# Patient Record
Sex: Female | Born: 1965 | Hispanic: Yes | Smoking: Former smoker
Health system: Southern US, Community
[De-identification: ages and names within clinical notes are randomized; demographics above are authoritative.]

## PROBLEM LIST (undated history)

## (undated) DIAGNOSIS — E663 Overweight: Secondary | ICD-10-CM

## (undated) DIAGNOSIS — E785 Hyperlipidemia, unspecified: Secondary | ICD-10-CM

## (undated) DIAGNOSIS — I10 Essential (primary) hypertension: Secondary | ICD-10-CM

## (undated) HISTORY — DX: Essential (primary) hypertension: I10

---
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
## 2014-12-22 DIAGNOSIS — I10 Essential (primary) hypertension: Secondary | ICD-10-CM

## 2014-12-22 HISTORY — DX: Essential (primary) hypertension: I10

## 2015-11-30 ENCOUNTER — Encounter (HOSPITAL_COMMUNITY): Payer: Self-pay | Admitting: Emergency Medicine

## 2015-11-30 ENCOUNTER — Emergency Department (HOSPITAL_COMMUNITY): Payer: No Typology Code available for payment source

## 2015-11-30 ENCOUNTER — Emergency Department (HOSPITAL_COMMUNITY)
Admission: EM | Admit: 2015-11-30 | Discharge: 2015-11-30 | Disposition: A | Discharge status: Home / Self Care | Payer: No Typology Code available for payment source | Attending: Emergency Medicine | Admitting: Emergency Medicine

## 2015-11-30 DIAGNOSIS — Y9389 Activity, other specified: Secondary | ICD-10-CM | POA: Insufficient documentation

## 2015-11-30 DIAGNOSIS — M25561 Pain in right knee: Secondary | ICD-10-CM

## 2015-11-30 DIAGNOSIS — Y998 Other external cause status: Secondary | ICD-10-CM | POA: Insufficient documentation

## 2015-11-30 DIAGNOSIS — M542 Cervicalgia: Secondary | ICD-10-CM

## 2015-11-30 DIAGNOSIS — S8991XA Unspecified injury of right lower leg, initial encounter: Secondary | ICD-10-CM | POA: Insufficient documentation

## 2015-11-30 DIAGNOSIS — S0990XA Unspecified injury of head, initial encounter: Secondary | ICD-10-CM | POA: Insufficient documentation

## 2015-11-30 DIAGNOSIS — S199XXA Unspecified injury of neck, initial encounter: Secondary | ICD-10-CM | POA: Diagnosis present

## 2015-11-30 DIAGNOSIS — Y92410 Unspecified street and highway as the place of occurrence of the external cause: Secondary | ICD-10-CM | POA: Diagnosis not present

## 2015-11-30 MED ORDER — METHOCARBAMOL 500 MG PO TABS
1000.0000 mg | ORAL_TABLET | Freq: Once | ORAL | Status: AC
  Administered 2015-11-30: 1000 mg via ORAL
  Filled 2015-11-30: qty 2

## 2015-11-30 MED ORDER — IBUPROFEN 800 MG PO TABS: 800.0000 mg | ORAL_TABLET | Freq: Three times a day (TID) | ORAL | Status: DC

## 2015-11-30 MED ORDER — OXYCODONE-ACETAMINOPHEN 5-325 MG PO TABS
1.0000 | ORAL_TABLET | Freq: Once | ORAL | Status: AC
  Administered 2015-11-30: 1 via ORAL
  Filled 2015-11-30: qty 1

## 2015-11-30 MED ORDER — METHOCARBAMOL 500 MG PO TABS: 500.0000 mg | ORAL_TABLET | Freq: Two times a day (BID) | ORAL | Status: DC

## 2015-11-30 MED ORDER — OXYCODONE-ACETAMINOPHEN 5-325 MG PO TABS: 1.0000 | ORAL_TABLET | ORAL | Status: DC | PRN

## 2015-11-30 MED ORDER — IBUPROFEN 400 MG PO TABS
800.0000 mg | ORAL_TABLET | Freq: Once | ORAL | Status: AC
  Administered 2015-11-30: 800 mg via ORAL
  Filled 2015-11-30: qty 2

## 2015-11-30 NOTE — ED Notes (Signed)
Pt ambulatory to bathroom without any problems 
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
## 2015-12-20 ENCOUNTER — Encounter (HOSPITAL_COMMUNITY): Payer: Self-pay | Admitting: *Deleted

## 2015-12-20 ENCOUNTER — Emergency Department (HOSPITAL_COMMUNITY)
Admission: EM | Admit: 2015-12-20 | Discharge: 2015-12-20 | Disposition: A | Discharge status: Home / Self Care | Payer: Self-pay | Attending: Emergency Medicine | Admitting: Emergency Medicine

## 2015-12-20 ENCOUNTER — Emergency Department (HOSPITAL_COMMUNITY): Payer: Self-pay

## 2015-12-20 DIAGNOSIS — Z79899 Other long term (current) drug therapy: Secondary | ICD-10-CM | POA: Insufficient documentation

## 2015-12-20 DIAGNOSIS — Y998 Other external cause status: Secondary | ICD-10-CM | POA: Insufficient documentation

## 2015-12-20 DIAGNOSIS — S8991XA Unspecified injury of right lower leg, initial encounter: Secondary | ICD-10-CM | POA: Insufficient documentation

## 2015-12-20 DIAGNOSIS — M5431 Sciatica, right side: Secondary | ICD-10-CM | POA: Insufficient documentation

## 2015-12-20 DIAGNOSIS — Y92410 Unspecified street and highway as the place of occurrence of the external cause: Secondary | ICD-10-CM | POA: Insufficient documentation

## 2015-12-20 DIAGNOSIS — Y9389 Activity, other specified: Secondary | ICD-10-CM | POA: Insufficient documentation

## 2015-12-20 DIAGNOSIS — S79911A Unspecified injury of right hip, initial encounter: Secondary | ICD-10-CM | POA: Insufficient documentation

## 2015-12-20 MED ORDER — CYCLOBENZAPRINE HCL 10 MG PO TABS: 5.0000 mg | ORAL_TABLET | Freq: Two times a day (BID) | ORAL | Status: DC | PRN

## 2015-12-20 MED ORDER — TRAMADOL HCL 50 MG PO TABS: 50.0000 mg | ORAL_TABLET | Freq: Four times a day (QID) | ORAL | Status: DC | PRN
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
## 2016-01-23 ENCOUNTER — Other Ambulatory Visit (HOSPITAL_COMMUNITY): Payer: Self-pay | Admitting: Orthopaedic Surgery

## 2016-01-25 ENCOUNTER — Other Ambulatory Visit (HOSPITAL_COMMUNITY): Payer: Self-pay | Admitting: Orthopaedic Surgery

## 2016-01-25 DIAGNOSIS — M545 Low back pain: Secondary | ICD-10-CM

## 2016-03-28 ENCOUNTER — Ambulatory Visit: Payer: Self-pay | Admitting: Internal Medicine

## 2016-03-30 ENCOUNTER — Encounter: Payer: Self-pay | Admitting: Internal Medicine

## 2016-03-30 NOTE — Progress Notes (Signed)
Patient left before being seen.  Had a job interview within an hour of the appointment and was afraid she would miss the interview.

## 2017-07-20 ENCOUNTER — Emergency Department (HOSPITAL_COMMUNITY)
Admission: EM | Admit: 2017-07-20 | Discharge: 2017-07-21 | Disposition: A | Discharge status: Home / Self Care | Payer: Medicaid Other | Attending: Emergency Medicine | Admitting: Emergency Medicine

## 2017-07-20 DIAGNOSIS — R5383 Other fatigue: Secondary | ICD-10-CM | POA: Insufficient documentation

## 2017-07-20 DIAGNOSIS — R531 Weakness: Secondary | ICD-10-CM | POA: Insufficient documentation

## 2017-07-20 DIAGNOSIS — R0602 Shortness of breath: Secondary | ICD-10-CM | POA: Diagnosis not present

## 2017-07-20 LAB — CBC WITH DIFFERENTIAL/PLATELET
Basophils Absolute: 0 10*3/uL (ref 0.0–0.1)
Basophils Relative: 0 %
EOS ABS: 0.1 10*3/uL (ref 0.0–0.7)
EOS PCT: 2 %
HCT: 37.1 % (ref 36.0–46.0)
Hemoglobin: 12.2 g/dL (ref 12.0–15.0)
LYMPHS ABS: 1.4 10*3/uL (ref 0.7–4.0)
LYMPHS PCT: 21 %
MCH: 29.8 pg (ref 26.0–34.0)
MCHC: 32.9 g/dL (ref 30.0–36.0)
MCV: 90.5 fL (ref 78.0–100.0)
MONO ABS: 0.4 10*3/uL (ref 0.1–1.0)
Monocytes Relative: 6 %
Neutro Abs: 4.8 10*3/uL (ref 1.7–7.7)
Neutrophils Relative %: 71 %
PLATELETS: 233 10*3/uL (ref 150–400)
RBC: 4.1 MIL/uL (ref 3.87–5.11)
RDW: 13.4 % (ref 11.5–15.5)
WBC: 6.7 10*3/uL (ref 4.0–10.5)

## 2017-07-20 LAB — BASIC METABOLIC PANEL
Anion gap: 7 (ref 5–15)
BUN: 16 mg/dL (ref 6–20)
CALCIUM: 8.9 mg/dL (ref 8.9–10.3)
CO2: 26 mmol/L (ref 22–32)
CREATININE: 0.76 mg/dL (ref 0.44–1.00)
Chloride: 105 mmol/L (ref 101–111)
GFR calc Af Amer: >60 mL/min (ref >60)
GLUCOSE: 154 mg/dL — AB (ref 65–99)
Potassium: 3 mmol/L — ABNORMAL LOW (ref 3.5–5.1)
SODIUM: 138 mmol/L (ref 135–145)

## 2017-07-20 MED ORDER — ONDANSETRON HCL 4 MG PO TABS: 4.0000 mg | ORAL_TABLET | Freq: Once | ORAL | Status: DC

## 2017-07-20 MED ORDER — SODIUM CHLORIDE 0.9 % IV BOLUS (SEPSIS)
1000.0000 mL | Freq: Once | INTRAVENOUS | Status: AC
  Administered 2017-07-20: 1000 mL via INTRAVENOUS

## 2017-07-20 NOTE — ED Provider Notes (Signed)
[facility]-EMERGENCY DEPT Provider Note   CSN: [id number] Arrival date & time: 07/20/17  2026     History   Chief Complaint Chief Complaint  Patient presents with  . Fatigue  . Nausea    HPI Marie Ayala is a 51 y.o. female.  The history is provided by the patient and the spouse. The history is limited by a language barrier. A language interpreter was used.   Patient presents by EMS with reported weakness and numbness/tingling throughout body. Sudden onset of symptoms after phone call and eating pork and rice.  Limited responses on exam, eyes closed. Poor effort.   She denies any chest pain, shortness of breath, fevers, chills, nausea, vomiting, or diarrhea. Husband denies previous symptoms similar to this episode.   No past medical history on file.  There are no active problems to display for this patient.   No past surgical history on file.  OB History    No data available       Home Medications    Prior to Admission medications   Medication Sig Start Date End Date Taking? Authorizing Provider  PRESCRIPTION MEDICATION Blood pressure   Yes [provider]    Family History No family history on file.  Social History Social History  Substance Use Topics  . Smoking status: Not on file  . Smokeless tobacco: Not on file  . Alcohol use Not on file     Allergies   Patient has no known allergies.   Review of Systems Review of Systems  Constitutional: Negative for chills and fever.  HENT: Negative for ear pain and sore throat.   Eyes: Negative for pain and visual disturbance.  Respiratory: Positive for shortness of breath. Negative for cough.   Cardiovascular: Negative for chest pain and palpitations.  Gastrointestinal: Negative for abdominal pain and vomiting.  Genitourinary: Negative for dysuria and hematuria.  Musculoskeletal: Negative for arthralgias and back pain.  Skin: Negative for color change and rash.  Neurological: Positive for weakness  (diffuse body). Negative for seizures and syncope.  All other systems reviewed and are negative.    Physical Exam Updated Vital Signs BP 133/80   Pulse 85   Temp 98.1 F (36.7 C) (Oral)   Resp 12   SpO2 99%   Physical Exam  Constitutional: She is oriented to person, place, and time. She appears well-developed and well-nourished. No distress.  HENT:  Head: Normocephalic and atraumatic.  Eyes: Conjunctivae are normal.  Neck: Neck supple.  Cardiovascular: Normal rate and regular rhythm.   No murmur heard. Pulmonary/Chest: Effort normal and breath sounds normal. No respiratory distress.  Abdominal: Soft. There is no tenderness.  Musculoskeletal: She exhibits no edema.  Neurological: She is alert and oriented to person, place, and time. No cranial nerve deficit or sensory deficit. She exhibits normal muscle tone. Coordination normal.  Diffuse weakness on exam, (1-2/5 strength diffusely), inconsistent, able to walk to bathroom without difficulty.   Skin: Skin is warm and dry.  Psychiatric: She has a normal mood and affect.  Nursing note and vitals reviewed.    ED Treatments / Results  Labs (all labs ordered are listed, but only abnormal results are displayed) Labs Reviewed  BASIC METABOLIC PANEL - Abnormal; Notable for the following:       Result Value   Potassium 3.0 (*)    Glucose, Bld 154 (*)    All other components within normal limits  CBC WITH DIFFERENTIAL/PLATELET    EKG  EKG Interpretation  Date/Time:  Monday July 20 2017 20:57:25 EDT Ventricular Rate:  87 PR Interval:    QRS Duration: 89 QT Interval:  373 QTC Calculation: 449 R Axis:   57 Text Interpretation:  Sinus rhythm Borderline prolonged PR interval Probable left atrial enlargement Borderline ECG Confirmed by Gerhard MunchLockwood, Robert [phone]([id number]) on 07/20/2017 11:18:58 PM       Radiology No results found.  Procedures Procedures (including critical care time)  Medications Ordered in ED Medications    ondansetron (ZOFRAN) tablet 4 mg (not administered)  sodium chloride 0.9 % bolus 1,000 mL (1,000 mLs Intravenous New Bag/Given 07/20/17 2137)     Initial Impression / Assessment and Plan / ED Course  I have reviewed the triage vital signs and the nursing notes.  Pertinent labs & imaging results that were available during my care of the patient were reviewed by me and considered in my medical decision making (see chart for details).     Upon further discussion with patient with myself and Attending, there have been significant stressors in patient's life. She has just returned yesterday from trip from [place] with previous partner - but did not return with son. This has been stressful to her. Patient seen to be moving all extremities (albiet with poor effort on exam). No focal deficits. Able to stand and walk. Stable labs and ekg without ischemic findings. Suspect symptoms due to stress and fatigue from recent events. Safe for discharge home with close follow up. Discussed return precautions with patient and family.   Final Clinical Impressions(s) / ED Diagnoses   Final diagnoses:  Weakness    New Prescriptions New Prescriptions   No medications on file     Wynelle ClevelandMizera, Lieutenant Abarca, MD 07/20/17 2324    Gerhard MunchLockwood, Robert, MD 07/22/17 Moses [id number]

## 2017-07-20 NOTE — ED Triage Notes (Signed)
Pt arrived via ems after experiencing weakness following dinner. Pt was c/o of numbness and tingling all over. Pt stated she ate pork and rice for dinner and felt nauseas after eating. Pt is very lethargic and selective with responses from staff. Pt is oriented to self, city and date but not hospital. [place]

## 2017-07-20 NOTE — Discharge Instructions (Signed)
Your exam and work up were reassuring at this time. Please return to ED with any new or concerning symptoms.

## 2017-07-22 ENCOUNTER — Other Ambulatory Visit: Payer: Self-pay

## 2017-07-22 ENCOUNTER — Emergency Department (HOSPITAL_COMMUNITY): Payer: Medicaid Other

## 2017-07-22 ENCOUNTER — Emergency Department (HOSPITAL_COMMUNITY)
Admission: EM | Admit: 2017-07-22 | Discharge: 2017-07-22 | Disposition: A | Discharge status: Home / Self Care | Payer: Medicaid Other | Attending: Emergency Medicine | Admitting: Emergency Medicine

## 2017-07-22 ENCOUNTER — Encounter (HOSPITAL_COMMUNITY): Payer: Self-pay | Admitting: *Deleted

## 2017-07-22 DIAGNOSIS — R200 Anesthesia of skin: Secondary | ICD-10-CM | POA: Diagnosis not present

## 2017-07-22 DIAGNOSIS — R42 Dizziness and giddiness: Secondary | ICD-10-CM | POA: Diagnosis present

## 2017-07-22 LAB — URINALYSIS, ROUTINE W REFLEX MICROSCOPIC
Bilirubin Urine: NEGATIVE
GLUCOSE, UA: NEGATIVE mg/dL
KETONES UR: NEGATIVE mg/dL
NITRITE: NEGATIVE
PROTEIN: NEGATIVE mg/dL
Specific Gravity, Urine: 1.006 (ref 1.005–1.030)
pH: 8 (ref 5.0–8.0)

## 2017-07-22 LAB — DIFFERENTIAL
BASOS PCT: 0 %
Basophils Absolute: 0 10*3/uL (ref 0.0–0.1)
EOS ABS: 0.1 10*3/uL (ref 0.0–0.7)
EOS PCT: 2 %
Lymphocytes Relative: 37 %
Lymphs Abs: 2.9 10*3/uL (ref 0.7–4.0)
Monocytes Absolute: 0.5 10*3/uL (ref 0.1–1.0)
Monocytes Relative: 7 %
NEUTROS PCT: 54 %
Neutro Abs: 4.3 10*3/uL (ref 1.7–7.7)

## 2017-07-22 LAB — COMPREHENSIVE METABOLIC PANEL
ALBUMIN: 3.9 g/dL (ref 3.5–5.0)
ALT: 15 U/L (ref 14–54)
ANION GAP: 9 (ref 5–15)
AST: 25 U/L (ref 15–41)
Alkaline Phosphatase: 86 U/L (ref 38–126)
BUN: 13 mg/dL (ref 6–20)
CALCIUM: 9 mg/dL (ref 8.9–10.3)
CHLORIDE: 106 mmol/L (ref 101–111)
CO2: 26 mmol/L (ref 22–32)
Creatinine, Ser: 0.73 mg/dL (ref 0.44–1.00)
GFR calc Af Amer: >60 mL/min (ref >60)
GFR calc non Af Amer: >60 mL/min (ref >60)
GLUCOSE: 126 mg/dL — AB (ref 65–99)
POTASSIUM: 3.3 mmol/L — AB (ref 3.5–5.1)
Sodium: 141 mmol/L (ref 135–145)
TOTAL PROTEIN: 7.2 g/dL (ref 6.5–8.1)
Total Bilirubin: 0.8 mg/dL (ref 0.3–1.2)

## 2017-07-22 LAB — I-STAT TROPONIN, ED
Troponin i, poc: 0 ng/mL (ref 0.00–0.08)
Troponin i, poc: 0 ng/mL (ref 0.00–0.08)

## 2017-07-22 LAB — CBC
HCT: 38.6 % (ref 36.0–46.0)
Hemoglobin: 12.9 g/dL (ref 12.0–15.0)
MCH: 30 pg (ref 26.0–34.0)
MCHC: 33.4 g/dL (ref 30.0–36.0)
MCV: 89.8 fL (ref 78.0–100.0)
Platelets: 254 10*3/uL (ref 150–400)
RBC: 4.3 MIL/uL (ref 3.87–5.11)
RDW: 12.9 % (ref 11.5–15.5)
WBC: 7.8 10*3/uL (ref 4.0–10.5)

## 2017-07-22 LAB — RAPID URINE DRUG SCREEN, HOSP PERFORMED
AMPHETAMINES: NOT DETECTED
BARBITURATES: NOT DETECTED
BENZODIAZEPINES: NOT DETECTED
Cocaine: NOT DETECTED
Opiates: NOT DETECTED
Tetrahydrocannabinol: POSITIVE — AB

## 2017-07-22 LAB — PROTIME-INR
INR: 0.97
PROTHROMBIN TIME: 12.9 s (ref 11.4–15.2)

## 2017-07-22 LAB — APTT: APTT: 26 s (ref 24–36)

## 2017-07-22 LAB — SALICYLATE LEVEL

## 2017-07-22 LAB — ACETAMINOPHEN LEVEL

## 2017-07-22 LAB — LIPASE, BLOOD: LIPASE: 25 U/L (ref 11–51)

## 2017-07-22 LAB — ETHANOL: Alcohol, Ethyl (B): <5 mg/dL (ref <5)

## 2017-07-22 MED ORDER — MECLIZINE HCL 25 MG PO TABS: 25.0000 mg | ORAL_TABLET | Freq: Three times a day (TID) | ORAL | 0 refills | Status: DC | PRN

## 2017-07-22 MED ORDER — SODIUM CHLORIDE 0.9 % IV BOLUS (SEPSIS)
1000.0000 mL | Freq: Once | INTRAVENOUS | Status: AC
  Administered 2017-07-22: 1000 mL via INTRAVENOUS

## 2017-07-22 MED ORDER — LORAZEPAM 2 MG/ML IJ SOLN: 1.0000 mg | Freq: Once | INTRAMUSCULAR | Status: DC

## 2017-07-22 NOTE — ED Notes (Signed)
TTS being completed. 

## 2017-07-22 NOTE — Discharge Instructions (Addendum)
Contact [facility] to establish care with a primary care provider for regular, routine medical care.  This clinic accepts patients without medical insurance. A primary care provider can adjust your daily medications and give you refills.

## 2017-07-22 NOTE — ED Provider Notes (Signed)
No medical hx, no medication.  Came in for dizziness, numbness in mouth and body, slurred speech, feeling drunk with nausea and epigastric abd pain.  Similar past Monday, and evaluation and thought it was related to stress.    Appears sleepy, drunk.  Plan to r/o pontine stroke with MRI.  Will check ETOH.  If negative, consider TTS consultation for further psychiatric assessment.  Pt currently denies SI/HI/AVH.  6:31 PM Brain MRI without concerning finding.  Therefore, will consult TTS for further assessment.    9:53 PM TTS has seen and evaluated pt and felt pt is psychiatrically cleared.  Pt currently resting comfortably, her sxs has resolved.  She feels comfortable going home.  Pt given resources for outpt f/u for further care.  Meclizine prescribed for dizziness. Return precaution discussed. All questions answered to pt's satisfaction.   BP 122/72   Pulse 68   Temp 97.7 F (36.5 C)   Resp 15   SpO2 99%   Results for orders placed or performed during the hospital encounter of 07/22/17  Protime-INR  Result Value Ref Range   Prothrombin Time 12.9 11.4 - 15.2 seconds   INR 0.97   APTT  Result Value Ref Range   aPTT 26 24 - 36 seconds  CBC  Result Value Ref Range   WBC 7.8 4.0 - 10.5 K/uL   RBC 4.30 3.87 - 5.11 MIL/uL   Hemoglobin 12.9 12.0 - 15.0 g/dL   HCT 16.1 09.6 - 04.5 %   MCV 89.8 78.0 - 100.0 fL   MCH 30.0 26.0 - 34.0 pg   MCHC 33.4 30.0 - 36.0 g/dL   RDW 40.9 81.1 - 91.4 %   Platelets 254 150 - 400 K/uL  Differential  Result Value Ref Range   Neutrophils Relative % 54 %   Neutro Abs 4.3 1.7 - 7.7 K/uL   Lymphocytes Relative 37 %   Lymphs Abs 2.9 0.7 - 4.0 K/uL   Monocytes Relative 7 %   Monocytes Absolute 0.5 0.1 - 1.0 K/uL   Eosinophils Relative 2 %   Eosinophils Absolute 0.1 0.0 - 0.7 K/uL   Basophils Relative 0 %   Basophils Absolute 0.0 0.0 - 0.1 K/uL  Comprehensive metabolic panel  Result Value Ref Range   Sodium 141 135 - 145 mmol/L   Potassium 3.3 (L)  3.5 - 5.1 mmol/L   Chloride 106 101 - 111 mmol/L   CO2 26 22 - 32 mmol/L   Glucose, Bld 126 (H) 65 - 99 mg/dL   BUN 13 6 - 20 mg/dL   Creatinine, Ser 7.82 0.44 - 1.00 mg/dL   Calcium 9.0 8.9 - 95.6 mg/dL   Total Protein 7.2 6.5 - 8.1 g/dL   Albumin 3.9 3.5 - 5.0 g/dL   AST 25 15 - 41 U/L   ALT 15 14 - 54 U/L   Alkaline Phosphatase 86 38 - 126 U/L   Total Bilirubin 0.8 0.3 - 1.2 mg/dL   GFR calc non Af Amer >60 >60 mL/min   GFR calc Af Amer >60 >60 mL/min   Anion gap 9 5 - 15  Urinalysis, Routine w reflex microscopic  Result Value Ref Range   Color, Urine STRAW (A) YELLOW   APPearance CLEAR CLEAR   Specific Gravity, Urine 1.006 1.005 - 1.030   pH 8.0 5.0 - 8.0   Glucose, UA NEGATIVE NEGATIVE mg/dL   Hgb urine dipstick SMALL (A) NEGATIVE   Bilirubin Urine NEGATIVE NEGATIVE   Ketones, ur NEGATIVE NEGATIVE  mg/dL   Protein, ur NEGATIVE NEGATIVE mg/dL   Nitrite NEGATIVE NEGATIVE   Leukocytes, UA TRACE (A) NEGATIVE   RBC / HPF 0-5 0 - 5 RBC/hpf   WBC, UA 0-5 0 - 5 WBC/hpf   Bacteria, UA RARE (A) NONE SEEN   Squamous Epithelial / LPF 0-5 (A) NONE SEEN   Mucous PRESENT    Hyaline Casts, UA PRESENT   Rapid urine drug screen (hospital performed)  Result Value Ref Range   Opiates NONE DETECTED NONE DETECTED   Cocaine NONE DETECTED NONE DETECTED   Benzodiazepines NONE DETECTED NONE DETECTED   Amphetamines NONE DETECTED NONE DETECTED   Tetrahydrocannabinol POSITIVE (A) NONE DETECTED   Barbiturates NONE DETECTED NONE DETECTED  Salicylate level  Result Value Ref Range   Salicylate Lvl <7.0 2.8 - 30.0 mg/dL  Acetaminophen level  Result Value Ref Range   Acetaminophen (Tylenol), Serum <10 (L) 10 - 30 ug/mL  Lipase, blood  Result Value Ref Range   Lipase 25 11 - 51 U/L  Ethanol  Result Value Ref Range   Alcohol, Ethyl (B) <5 <5 mg/dL  I-stat troponin, ED  Result Value Ref Range   Troponin i, poc 0.00 0.00 - 0.08 ng/mL   Comment 3          I-Stat Troponin, ED (not [facility])   Result Value Ref Range   Troponin i, poc 0.00 0.00 - 0.08 ng/mL   Comment 3           Ct Head Wo Contrast  Result Date: 07/22/2017 CLINICAL DATA:  Dizziness and upper extremity weakness.  Lethargy. EXAM: CT HEAD WITHOUT CONTRAST TECHNIQUE: Contiguous axial images were obtained from the base of the skull through the vertex without intravenous contrast. COMPARISON:  None. FINDINGS: Brain: The ventricles are normal in size and configuration. There is no intracranial mass, hemorrhage, extra-axial fluid collection, or midline shift. Gray-white compartments appear normal. No evident acute infarct. Vascular: No hyperdense vessel. There is minimal calcification in the left carotid siphon. Skull: Bony calvarium appears intact. Sinuses/Orbits: There is mucosal thickening in several ethmoid air cells bilaterally. Other visualized paranasal sinuses are clear. Orbits appear symmetric bilaterally. Other: Mastoid air cells are clear. IMPRESSION: No intracranial mass, hemorrhage, or extra-axial fluid collection. Gray-white compartments appear normal. Minimal left carotid siphon calcification. Mild ethmoid air cell mucosal thickening bilaterally. Electronically Signed   By: Bretta BangWilliam  Woodruff III M.D.   On: 07/22/2017 13:10   Mr Brain Wo Contrast  Result Date: 07/22/2017 CLINICAL DATA:  Altered mental status and numbness. EXAM: MRI HEAD WITHOUT CONTRAST TECHNIQUE: Multiplanar, multiecho pulse sequences of the brain and surrounding structures were obtained without intravenous contrast. COMPARISON:  Head CT 07/22/2017 FINDINGS: Brain: The midline structures are normal. There is no focal diffusion restriction to indicate acute infarct. The brain parenchymal signal is normal and there is no mass lesion. No intraparenchymal hematoma or chronic microhemorrhage. Brain volume is normal for age without age-advanced or lobar predominant atrophy. The dura is normal and there is no extra-axial collection. Vascular: Major intracranial  arterial and venous sinus flow voids are preserved. Skull and upper cervical spine: The visualized skull base, calvarium, upper cervical spine and extracranial soft tissues are normal. Sinuses/Orbits: No fluid levels or advanced mucosal thickening. No mastoid or middle ear effusion. Normal orbits. IMPRESSION: Normal MRI of the brain. Electronically Signed   By: Deatra RobinsonKevin  Herman M.D.   On: 07/22/2017 17:36      Fayrene Helperran, Jannifer Fischler, PA-C 07/22/17 2154    Azalia Bilisampos, Kevin,  MD 07/22/17 2346
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
## 2017-07-22 NOTE — ED Notes (Signed)
Patient to MRI.

## 2017-07-22 NOTE — BH Assessment (Signed)
Tele Assessment Note   Marie Ayala is an 51 y.o. female patient presenting to the ED with physical symptoms of dizziness, numbness, complaints of slurred speech. The patient reports she has experienced these symptoms in the past. The patient spoke through a spanish interpreter. She denied SI, HI and A/V. Indicates she has no history of mental health issues but did receive counseling at one time. Denied previous psychiatric inpatient. Denied past attempts of suicide. Denied drug or alcohol use despite positive UDS for cannabis.   The patient reports arguing with her 50 yr old daughter earlier in the day. States they had a "small arguement" about money that she lent her daughter. The patient lives with her husband and son, 636 yrs old. The patient works at [employer], full-time. Denies missing work due to physical or mental health issues. The patient had unremarkable appearance, fair eye contact, freedom of movement, logical speech, euthymic mood, blunted affect, panic attacks "every now and then", unimpaired judgment, fair insight and impulse control.    Marie Ayala recommends outpatient resources. Psychiatrically cleared.      Diagnosis: Generalized Anxiety Disorder  Past Medical History: History reviewed. No pertinent past medical history.  Past Surgical History:  Procedure Laterality Date  . CESAREAN SECTION      Family History: History reviewed. No pertinent family history.  Social History:  reports that she has never smoked. She does not have any smokeless tobacco history on file. She reports that she does not drink alcohol or use drugs.  Additional Social History:     CIWA: CIWA-Ar BP: 110/70 Pulse Rate: 68 COWS:    PATIENT STRENGTHS: (choose at least two) Average or above average intelligence General fund of knowledge  Allergies: No Known Allergies  Home Medications:  (Not in a hospital admission)  OB/GYN Status:  No LMP recorded. Patient is  postmenopausal.  General Assessment Data [facility] Assessment: In system Is this a Tele or Face-to-Face Assessment?: Tele Assessment Is this an Initial Assessment or a Re-assessment for this encounter?: Initial Assessment Marital status: Married GerlachMaiden name: n/a Is patient pregnant?: No Pregnancy Status: No Living Arrangements: Spouse/significant other Can pt return to current living arrangement?: Yes Admission Status: Voluntary Is patient capable of signing voluntary admission?: Yes Referral Source: Self/Family/Friend Insurance type: self pay     Crisis Care Plan Living Arrangements: Spouse/significant other Name of Psychiatrist: no Name of Therapist: yes  Education Status Is patient currently in school?: No Highest grade of school patient has completed: 9th grade   Risk to self with the past 6 months Suicidal Ideation: No Has patient been a risk to self within the past 6 months prior to admission? : No Suicidal Intent: No Has patient had any suicidal intent within the past 6 months prior to admission? : No Is patient at risk for suicide?: No Suicidal Plan?: No Has patient had any suicidal plan within the past 6 months prior to admission? : No Access to Means: No What has been your use of drugs/alcohol within the last 12 months?: positive UDS for cannabis Previous Attempts/Gestures: No How many times?: 0 Other Self Harm Risks: 0 Intentional Self Injurious Behavior: None Family Suicide History: No Recent stressful life event(s): Conflict (Comment) (with daughter) Persecutory voices/beliefs?: No Depression: No Substance abuse history and/or treatment for substance abuse?: No (denies) Suicide prevention information given to non-admitted patients: Not applicable  Risk to Others within the past 6 months Homicidal Ideation: No Does patient have any lifetime  risk of violence toward others beyond the six months prior to admission? : No Thoughts of  Harm to Others: No Current Homicidal Intent: No Current Homicidal Plan: No Access to Homicidal Means: No History of harm to others?: No Assessment of Violence: None Noted Does patient have access to weapons?: No Criminal Charges Pending?: No Does patient have a court date: No Is patient on probation?: No  Psychosis Hallucinations: None noted Delusions: None noted  Mental Status Report Appearance/Hygiene: Unremarkable Eye Contact: Fair Motor Activity: Freedom of movement Speech: Logical/coherent Level of Consciousness: Alert Mood: Euthymic Affect: Blunted Anxiety Level: Panic Attacks Panic attack frequency: "every now and then" Thought Processes: Coherent, Relevant Judgement: Unimpaired Orientation: Person, Place, Time, Situation Obsessive Compulsive Thoughts/Behaviors: None  Cognitive Functioning Concentration: Normal Memory: Recent Intact, Remote Intact IQ: Average Insight: Poor Impulse Control: Poor Appetite: Good Weight Loss: 0 Weight Gain: 0 Sleep: No Change Vegetative Symptoms: None  [facility] Patient's cognitive ability adequate to safely complete daily activities?: Yes Patient able to express need for assistance with ADLs?: Yes Independently performs ADLs?: Yes (appropriate for developmental age)  Prior Inpatient Therapy Prior Inpatient Therapy: No  Prior Outpatient Therapy Prior Outpatient Therapy: Yes Prior Therapy Dates: unknown  Prior Therapy Facilty/Provider(s): unknown Reason for Treatment: unknown Does patient have an ACCT team?: No Does patient have Intensive In-House Services?  : No Does patient have [facility] services? : No Does patient have P4[facility] services?: No  ADL Screening (condition at time of admission) Patient's cognitive ability adequate to safely complete daily activities?: Yes Patient able to express need for assistance with ADLs?: Yes Independently performs ADLs?: Yes (appropriate for developmental  age)             Merchant navy officerAdvance Directives (For Healthcare) Does Patient Have a Medical Advance Directive?: No    Additional Information 1:1 In Past 12 Months?: No CIRT Risk: No Elopement Risk: No Does patient have medical clearance?: Yes     Disposition:  Disposition Initial Assessment Completed for this Encounter: Yes Disposition of Patient: Other dispositions Other disposition(s): Other (Comment)  [facility] 07/22/2017 7:42 PM

## 2017-07-22 NOTE — BH Assessment (Signed)
Donell SievertSpencer Simon NP provider on call recommends outpatient resources. Psychiatrically cleared

## 2017-07-22 NOTE — ED Notes (Signed)
Pt. Back from MRI. Pt. Resting comfortably.

## 2017-07-22 NOTE — ED Triage Notes (Signed)
Used interpretor. Pt reports onset of "numbness" to entire body today while eating today. Has dizziness and feels like room is spinning. Has nausea. Denies headache or sensitivity to light. Has weakness to bilateral arms and legs, weak grips bilaterally. No unilateral weakness noted. No speech deficit noted, no facial droop noted.

## 2017-07-22 NOTE — ED Notes (Signed)
Patient c/o dizziness and epigastric abd pain and h/a for last 3 days. Seen here and was discharged. Patient states it hasn't gotten better. Generalized weakness. Low bp. Fatigue. Numbness/tingling all over. Denies n/v/d. No significant medical hx.

## 2017-07-23 LAB — URINE CULTURE

## 2017-07-28 ENCOUNTER — Encounter: Payer: Self-pay | Admitting: Internal Medicine

## 2017-07-28 ENCOUNTER — Ambulatory Visit (INDEPENDENT_AMBULATORY_CARE_PROVIDER_SITE_OTHER): Payer: Self-pay | Admitting: Internal Medicine

## 2017-07-28 VITALS — BP 170/110 | HR 70 | Resp 12 | Ht 64.0 in | Wt 164.0 lb

## 2017-07-28 DIAGNOSIS — Z7689 Persons encountering health services in other specified circumstances: Secondary | ICD-10-CM

## 2017-07-28 DIAGNOSIS — R079 Chest pain, unspecified: Secondary | ICD-10-CM

## 2017-07-28 DIAGNOSIS — I10 Essential (primary) hypertension: Secondary | ICD-10-CM | POA: Insufficient documentation

## 2017-07-28 DIAGNOSIS — Z13220 Encounter for screening for lipoid disorders: Secondary | ICD-10-CM

## 2017-07-28 HISTORY — DX: Essential (primary) hypertension: I10

## 2017-07-28 MED ORDER — METOPROLOL TARTRATE 25 MG PO TABS: 25.0000 mg | ORAL_TABLET | Freq: Two times a day (BID) | ORAL | 11 refills | Status: DC

## 2017-07-28 NOTE — Progress Notes (Signed)
   Subjective:    Patient ID: Marie Ayala, female    DOB: 09/14/1966, 51 y.o.   MRN: [id number]  HPI   1.  High Blood pressure:  Thinks she was diagnosed 2 years ago.  Was on medication for a while, which she took once daily.  Was visiting in [place] at the time.  She only took for one month.  Did not return to the clinic to see if her bp was controlled. Has been living in [place] years.  Previously did live in [place] area.   Is having headaches with elevated bp.  Had some anterior chest pain 2 weeks ago when walking.  Lasted 2 minutes--sat down and relaxed.  Had never had before.  No radiation to back, jaw, arm.  No associated dyspnea.    No outpatient prescriptions have been marked as taking for the 07/28/17 encounter (Office Visit) with Julieanne MansonMulberry, Stephan Nelis, MD.    No Known Allergies   Past Medical History:  Diagnosis Date  . Hypertension 2016   Past Surgical History:  Procedure Laterality Date  . CESAREAN SECTION  1991, 2001, 2012    Family History  Problem Relation Age of Onset  . Hypertension Mother   . Heart disease Mother        MI  . Diabetes Mother     Social History   Social History  . Marital status: Married    Spouse name: Ang[place]Israel Davialo  . Number of children: 5  . Years of education: 9   Occupational History  . unemployed. Worked at [employer] previoulsly    Social History Main Topics  . Smoking status: Never Smoker  . Smokeless tobacco: Never Used  . Alcohol use No  . Drug use: No  . Sexual activity: Not on file   Other Topics Concern  . Not on file   Social History Narrative   Originally from [place]   [place] [id number]   [place]ives in [place] with husband and young son.   50 yo daughter lives in [place] with her father.   Rest of children are adults and on their own     Review of Systems     Objective:   Physical Exam   HEENT: PERRL, EOMI, discs sharp bilaterally, throat without injection Neck:  Supple, No  adenopathy.  No thyromegaly Chest:  CTA CV:  RRR with normal S1 and S2, No S3, S4 or murmur.  No carotid bruits.  Carotid, radial,  DP and PT pulses normal and equal Abd:  S, NT, No HSM or mass, + BS.  No bruits LE:  No edema.   ECG:  NSR, normal ECG     Assessment & Plan:  1.  Essential Hypertension:  Start Metoprolol 25 mg twice daily with recheck of bp and pulse in 1 week.   Long and repeated discussion regarding need to stay on medication and follow up to be certain medication is adequately treating bp. Discussion that her bp will go back up if she stops.  2.  Chest pain 2 weeks ago:  Short lived and has had regular physical activity since without pain.  Check ECG:  Returned with NSR and normal.   CBC, CMP, FLP. Followup with me in 2 months.  3.  Social:  Tawnya Crookybreisha Terry, LCSWA here to assess patient, warm hand off

## 2017-07-29 LAB — CBC WITH DIFFERENTIAL/PLATELET
BASOS: 0 %
Basophils Absolute: 0 10*3/uL (ref 0.0–0.2)
EOS (ABSOLUTE): 0.2 10*3/uL (ref 0.0–0.4)
Eos: 2 %
HEMATOCRIT: 39.2 % (ref 34.0–46.6)
HEMOGLOBIN: 12.9 g/dL (ref 11.1–15.9)
IMMATURE GRANS (ABS): 0 10*3/uL (ref 0.0–0.1)
Immature Granulocytes: 0 %
LYMPHS: 27 %
Lymphocytes Absolute: 1.8 10*3/uL (ref 0.7–3.1)
MCH: 30.3 pg (ref 26.6–33.0)
MCHC: 32.9 g/dL (ref 31.5–35.7)
MCV: 92 fL (ref 79–97)
MONOCYTES: 8 %
Monocytes Absolute: 0.5 10*3/uL (ref 0.1–0.9)
NEUTROS ABS: 4.3 10*3/uL (ref 1.4–7.0)
Neutrophils: 63 %
Platelets: 214 10*3/uL (ref 150–379)
RBC: 4.26 x10E6/uL (ref 3.77–5.28)
RDW: 13.9 % (ref 12.3–15.4)
WBC: 6.8 10*3/uL (ref 3.4–10.8)

## 2017-07-29 LAB — COMPREHENSIVE METABOLIC PANEL
A/G RATIO: 1.6 (ref 1.2–2.2)
ALBUMIN: 4.5 g/dL (ref 3.5–5.5)
ALT: 19 IU/L (ref 0–32)
AST: 18 IU/L (ref 0–40)
Alkaline Phosphatase: 97 IU/L (ref 39–117)
BILIRUBIN TOTAL: 0.6 mg/dL (ref 0.0–1.2)
BUN / CREAT RATIO: 25 — AB (ref 9–23)
BUN: 14 mg/dL (ref 6–24)
CHLORIDE: 104 mmol/L (ref 96–106)
CO2: 25 mmol/L (ref 20–29)
Calcium: 9.4 mg/dL (ref 8.7–10.2)
Creatinine, Ser: 0.56 mg/dL — ABNORMAL LOW (ref 0.57–1.00)
GFR calc non Af Amer: 110 mL/min/{1.73_m2} (ref >59)
GFR, EST AFRICAN AMERICAN: 127 mL/min/{1.73_m2} (ref >59)
Globulin, Total: 2.9 g/dL (ref 1.5–4.5)
Glucose: 90 mg/dL (ref 65–99)
POTASSIUM: 4.6 mmol/L (ref 3.5–5.2)
Sodium: 144 mmol/L (ref 134–144)
TOTAL PROTEIN: 7.4 g/dL (ref 6.0–8.5)

## 2017-07-29 LAB — LIPID PANEL W/O CHOL/HDL RATIO
Cholesterol, Total: 298 mg/dL — ABNORMAL HIGH (ref 100–199)
HDL: 48 mg/dL (ref >39)
LDL Calculated: 195 mg/dL — ABNORMAL HIGH (ref 0–99)
Triglycerides: 274 mg/dL — ABNORMAL HIGH (ref 0–149)
VLDL CHOLESTEROL CAL: 55 mg/dL — AB (ref 5–40)

## 2017-08-01 MED ORDER — ATORVASTATIN CALCIUM 40 MG PO TABS: 40.0000 mg | ORAL_TABLET | Freq: Every day | ORAL | 11 refills | Status: DC

## 2017-08-01 NOTE — Addendum Note (Signed)
Addended by: Marcene DuosMULBERRY, Myron Stankovich M on: 08/01/2017 12:33 PM   Modules accepted: Orders

## 2017-08-06 ENCOUNTER — Ambulatory Visit (INDEPENDENT_AMBULATORY_CARE_PROVIDER_SITE_OTHER): Payer: Self-pay

## 2017-08-06 VITALS — BP 124/72 | HR 78

## 2017-08-06 DIAGNOSIS — I10 Essential (primary) hypertension: Secondary | ICD-10-CM

## 2017-08-06 NOTE — Progress Notes (Signed)
Discussed with Dr. Margo AyeMulberry--her notes regarding labs and elevated cholesterol did not go through for some reason.  Patient was to start on Atorvastatin 40 mg daily and be scheduled for return visit in 6 weeks with FLP/Hepatic profile.

## 2017-09-28 ENCOUNTER — Ambulatory Visit: Payer: Self-pay | Admitting: Internal Medicine

## 2017-10-06 ENCOUNTER — Ambulatory Visit: Payer: Self-pay | Admitting: Internal Medicine

## 2017-10-20 ENCOUNTER — Other Ambulatory Visit: Payer: Medicaid Other

## 2017-10-23 ENCOUNTER — Other Ambulatory Visit (INDEPENDENT_AMBULATORY_CARE_PROVIDER_SITE_OTHER): Payer: Self-pay

## 2017-10-23 DIAGNOSIS — Z79899 Other long term (current) drug therapy: Secondary | ICD-10-CM

## 2017-10-23 DIAGNOSIS — Z13220 Encounter for screening for lipoid disorders: Secondary | ICD-10-CM

## 2017-10-24 LAB — LIPID PANEL W/O CHOL/HDL RATIO
Cholesterol, Total: 166 mg/dL (ref 100–199)
HDL: 44 mg/dL (ref >39)
LDL CALC: 100 mg/dL — AB (ref 0–99)
Triglycerides: 112 mg/dL (ref 0–149)
VLDL CHOLESTEROL CAL: 22 mg/dL (ref 5–40)

## 2017-10-24 LAB — HEPATIC FUNCTION PANEL
ALT: 14 IU/L (ref 0–32)
AST: 13 IU/L (ref 0–40)
Albumin: 4.1 g/dL (ref 3.5–5.5)
Alkaline Phosphatase: 98 IU/L (ref 39–117)
BILIRUBIN TOTAL: 0.6 mg/dL (ref 0.0–1.2)
BILIRUBIN, DIRECT: 0.14 mg/dL (ref 0.00–0.40)
TOTAL PROTEIN: 6.8 g/dL (ref 6.0–8.5)

## 2017-11-02 ENCOUNTER — Other Ambulatory Visit (INDEPENDENT_AMBULATORY_CARE_PROVIDER_SITE_OTHER): Payer: Medicaid Other

## 2017-11-02 DIAGNOSIS — R5383 Other fatigue: Secondary | ICD-10-CM

## 2017-11-02 LAB — POCT URINE PREGNANCY: Preg Test, Ur: NEGATIVE

## 2017-12-22 DIAGNOSIS — E785 Hyperlipidemia, unspecified: Secondary | ICD-10-CM | POA: Insufficient documentation

## 2017-12-22 HISTORY — DX: Hyperlipidemia, unspecified: E78.5

## 2018-04-30 ENCOUNTER — Other Ambulatory Visit: Payer: Self-pay

## 2018-04-30 DIAGNOSIS — R748 Abnormal levels of other serum enzymes: Secondary | ICD-10-CM

## 2018-05-01 LAB — HEPATIC FUNCTION PANEL
ALT: 13 IU/L (ref 0–32)
AST: 12 IU/L (ref 0–40)
Albumin: 4.2 g/dL (ref 3.5–5.5)
Alkaline Phosphatase: 96 IU/L (ref 39–117)
BILIRUBIN TOTAL: 0.5 mg/dL (ref 0.0–1.2)
Bilirubin, Direct: 0.12 mg/dL (ref 0.00–0.40)
Total Protein: 7 g/dL (ref 6.0–8.5)

## 2018-09-17 ENCOUNTER — Ambulatory Visit: Payer: Medicaid Other

## 2018-09-17 VITALS — BP 152/90 | HR 68

## 2018-09-17 DIAGNOSIS — I10 Essential (primary) hypertension: Secondary | ICD-10-CM | POA: Diagnosis not present

## 2018-09-17 NOTE — Progress Notes (Signed)
Patient states she has been having dizziness, headaches and feeling like something is holding her around her throat. Patient has not been taking Metoprolol as directed. Patient states she took 2 pills at one time yesterday and before that she was only taking one a day.. States headache and dizziness started 2 days ago.  Per Dr. Delrae Alfred no changes to medication at this time. Patient to consistently taking medication twice a day. And repeat BP check in 2 weeks for nurse visit. Patient verbalized understanding.

## 2018-10-01 ENCOUNTER — Ambulatory Visit (INDEPENDENT_AMBULATORY_CARE_PROVIDER_SITE_OTHER): Payer: Medicaid Other

## 2018-10-01 VITALS — BP 124/82 | HR 74

## 2018-10-01 DIAGNOSIS — I10 Essential (primary) hypertension: Secondary | ICD-10-CM

## 2018-10-01 NOTE — Progress Notes (Signed)
Patient BP now in normal range. Patient instructed to continue taking medication daily. Patient verbalized understanding. Patient has not seen Dr. Delrae Alfred in over a year. Patient informed she needs follow up with doctor and appointment has been scheduled

## 2018-10-28 ENCOUNTER — Other Ambulatory Visit: Payer: Self-pay | Admitting: Internal Medicine

## 2018-10-29 ENCOUNTER — Other Ambulatory Visit: Payer: Self-pay

## 2018-10-29 ENCOUNTER — Ambulatory Visit: Payer: Medicaid Other | Admitting: Internal Medicine

## 2018-10-29 MED ORDER — METOPROLOL TARTRATE 25 MG PO TABS: 25.0000 mg | ORAL_TABLET | Freq: Two times a day (BID) | ORAL | 1 refills | Status: DC

## 2018-12-02 ENCOUNTER — Encounter: Payer: Self-pay | Admitting: Internal Medicine

## 2018-12-02 ENCOUNTER — Ambulatory Visit: Payer: Medicaid Other | Admitting: Internal Medicine

## 2018-12-02 VITALS — BP 160/102 | HR 80 | Resp 12 | Ht 64.0 in | Wt 172.0 lb

## 2018-12-02 DIAGNOSIS — Z716 Tobacco abuse counseling: Secondary | ICD-10-CM | POA: Diagnosis not present

## 2018-12-02 DIAGNOSIS — H547 Unspecified visual loss: Secondary | ICD-10-CM

## 2018-12-02 DIAGNOSIS — I10 Essential (primary) hypertension: Secondary | ICD-10-CM | POA: Diagnosis not present

## 2018-12-02 DIAGNOSIS — E7800 Pure hypercholesterolemia, unspecified: Secondary | ICD-10-CM

## 2018-12-02 MED ORDER — ATORVASTATIN CALCIUM 40 MG PO TABS: 40.0000 mg | ORAL_TABLET | Freq: Every day | ORAL | 11 refills | Status: DC

## 2018-12-02 MED ORDER — METOPROLOL TARTRATE 25 MG PO TABS: 25.0000 mg | ORAL_TABLET | Freq: Two times a day (BID) | ORAL | 11 refills | Status: DC

## 2018-12-02 NOTE — Progress Notes (Signed)
   Subjective:    Patient ID: Marie Ayala, female    DOB: 10/10/1966, 52 y.o.   MRN: [id number]  HPI   Has not been seen in over a year.   1.  Hypertension:  Ran out of Metoprolol last month.  No chest pain, exertional dyspnea or peripheral edema.  2.  Hyperlipidemia:  Also out of Atorvastatin for 2 months. No problems on the medication.  3. Rare tobacco use:  Smokes once to twice monthly.  She states she does not need support to quit.     No outpatient medications have been marked as taking for the 12/02/18 encounter (Office Visit) with Julieanne MansonMulberry, Kennedy Bohanon, MD.    No Known Allergies  Review of Systems     Objective:   Physical Exam NAD HEENT:  PERRL, EOMI Neck:  Supple, no adenopathy, no thyromegaly Chest:  CTA CV:  RRR with normal S1 and S2, No S3, S4 or murmur.  Radial and DP pulses normal and equal Abd:  S, NT, No HSM or mass + BS LE:  No edema.       Assessment & Plan:  1.  Hypertension:  Restart Metoprolol 25 mg twice daily.  Discussed needs to follow up once to twice yearly. BP and pulse check in 1-2 weeks   2.  Hyperlipidemia:  Restart Atorvastatin 40 mg daily.  FLP and hepatic profile in 6 weeks.  3.  HM:  CPE with pap in 3 months  4.  Decreased Visual Acuity:  Referral to Optometry  5.  Tobacco use:  Plans to quit completely.  Gave quit line to see about getting nicotine lozenges for free and support.  She does not feel she will need.

## 2018-12-22 DIAGNOSIS — E663 Overweight: Secondary | ICD-10-CM

## 2018-12-22 HISTORY — DX: Overweight: E66.3

## 2018-12-24 ENCOUNTER — Ambulatory Visit: Payer: Medicaid Other | Admitting: Internal Medicine

## 2018-12-24 VITALS — BP 140/90 | HR 70

## 2018-12-24 DIAGNOSIS — I10 Essential (primary) hypertension: Secondary | ICD-10-CM

## 2018-12-26 NOTE — Progress Notes (Signed)
Here for BP check BP improved, though not quite at goal with restart of medication. No change to meds at this time. Has follow up scheduled

## 2019-01-13 ENCOUNTER — Other Ambulatory Visit (INDEPENDENT_AMBULATORY_CARE_PROVIDER_SITE_OTHER): Payer: Medicaid Other

## 2019-01-13 DIAGNOSIS — E7800 Pure hypercholesterolemia, unspecified: Secondary | ICD-10-CM

## 2019-01-13 DIAGNOSIS — Z79899 Other long term (current) drug therapy: Secondary | ICD-10-CM

## 2019-01-14 LAB — LIPID PANEL W/O CHOL/HDL RATIO
Cholesterol, Total: 182 mg/dL (ref 100–199)
HDL: 40 mg/dL (ref >39)
LDL Calculated: 103 mg/dL — ABNORMAL HIGH (ref 0–99)
Triglycerides: 193 mg/dL — ABNORMAL HIGH (ref 0–149)
VLDL CHOLESTEROL CAL: 39 mg/dL (ref 5–40)

## 2019-01-14 LAB — HEPATIC FUNCTION PANEL
ALBUMIN: 4.2 g/dL (ref 3.8–4.9)
ALT: 19 IU/L (ref 0–32)
AST: 21 IU/L (ref 0–40)
Alkaline Phosphatase: 100 IU/L (ref 39–117)
BILIRUBIN TOTAL: 0.9 mg/dL (ref 0.0–1.2)
Bilirubin, Direct: 0.17 mg/dL (ref 0.00–0.40)
Total Protein: 6.9 g/dL (ref 6.0–8.5)

## 2019-03-10 ENCOUNTER — Encounter: Payer: Medicaid Other | Admitting: Internal Medicine

## 2019-04-18 ENCOUNTER — Encounter: Payer: Medicaid Other | Admitting: Internal Medicine

## 2019-06-13 ENCOUNTER — Encounter: Payer: Self-pay | Admitting: Internal Medicine

## 2019-08-02 ENCOUNTER — Ambulatory Visit: Payer: Medicaid Other | Admitting: Internal Medicine

## 2019-08-02 ENCOUNTER — Encounter: Payer: Self-pay | Admitting: Internal Medicine

## 2019-08-02 ENCOUNTER — Other Ambulatory Visit: Payer: Self-pay

## 2019-08-02 VITALS — BP 138/84 | HR 70 | Temp 98.3°F | Resp 12 | Ht 64.0 in | Wt 168.0 lb

## 2019-08-02 DIAGNOSIS — I10 Essential (primary) hypertension: Secondary | ICD-10-CM

## 2019-08-02 DIAGNOSIS — E782 Mixed hyperlipidemia: Secondary | ICD-10-CM

## 2019-08-02 DIAGNOSIS — Z1211 Encounter for screening for malignant neoplasm of colon: Secondary | ICD-10-CM

## 2019-08-02 DIAGNOSIS — E010 Iodine-deficiency related diffuse (endemic) goiter: Secondary | ICD-10-CM

## 2019-08-02 DIAGNOSIS — E663 Overweight: Secondary | ICD-10-CM

## 2019-08-02 DIAGNOSIS — N898 Other specified noninflammatory disorders of vagina: Secondary | ICD-10-CM

## 2019-08-02 DIAGNOSIS — Z0000 Encounter for general adult medical examination without abnormal findings: Secondary | ICD-10-CM

## 2019-08-02 DIAGNOSIS — Z716 Tobacco abuse counseling: Secondary | ICD-10-CM

## 2019-08-02 DIAGNOSIS — Z79899 Other long term (current) drug therapy: Secondary | ICD-10-CM

## 2019-08-02 DIAGNOSIS — Z720 Tobacco use: Secondary | ICD-10-CM

## 2019-08-02 DIAGNOSIS — Z124 Encounter for screening for malignant neoplasm of cervix: Secondary | ICD-10-CM

## 2019-08-02 DIAGNOSIS — Z1239 Encounter for other screening for malignant neoplasm of breast: Secondary | ICD-10-CM

## 2019-08-02 LAB — POCT WET PREP WITH KOH
KOH Prep POC: NEGATIVE
RBC Wet Prep HPF POC: NEGATIVE
Trichomonas, UA: NEGATIVE
Yeast Wet Prep HPF POC: NEGATIVE

## 2019-08-02 NOTE — Patient Instructions (Signed)

## 2019-08-02 NOTE — Progress Notes (Signed)
Subjective:    Patient ID: Marie Ayala, female   DOB: 04/25/1966, 53 y.o.   MRN: [id number]   HPI   CPE with pap  1.  Pap:  Last pap 3 years ago and normal.  Last was in [place].  History of abnormal pap years ago.  Describes colposcopy with no concerning findings.  Follow up pap smears were normal.    2.  Mammogram:  Never. No family history of breast cancer.  3.  Osteoprevention:    She drinks 2% milk once daily.   No family history of osteoporosis.  Willing to drink 3 cups daily.  No exercise.  Willing to start walking daily.  4.  Guaiac Cards: Never.   5.  Colonoscopy:  Never.  No family history of colon cancer.  6.  Immunizations:  Does not obtain influenza-listens to people she knows who state it causes more illness.  Discussed cannot cause flu Believes she has had Td in past 10 years.    7.  Glucose/Cholesterol:  Blood glucose normal in the past. Has hyperlipidemia Lipid Panel     Component Value Date/Time   CHOL 182 01/13/2019 0913   TRIG 193 (H) 01/13/2019 0913   HDL 40 01/13/2019 0913   LDLCALC 103 (H) 01/13/2019 0913    Current Meds  Medication Sig  . atorvastatin (LIPITOR) 40 MG tablet Take 1 tablet (40 mg total) by mouth daily. With evening meal  . metoprolol tartrate (LOPRESSOR) 25 MG tablet Take 1 tablet (25 mg total) by mouth 2 (two) times daily.   No Known Allergies   Past Medical History:  Diagnosis Date  . Essential hypertension 07/28/2017  . Hyperlipidemia 2019  . Hypertension 2016  . Overweight (BMI 25.0-29.9) 2020    Past Surgical History:  Procedure Laterality Date  . CESAREAN SECTION  1991, 2001, 2012    Family History  Problem Relation Age of Onset  . Hypertension Mother   . Heart disease Mother        MI  . Diabetes Mother      Social History   Socioeconomic History  . Marital status: Married    Spouse name: [place]Israel Davila  . Number of children: 5  . Years of education: 9  . Highest education  level: Not on file  Occupational History  . Occupation: unemployed. Worked at [employer] previoulsly  Social Needs  . Financial resource strain: Hard  . Food insecurity    Worry: Never true    Inability: Never true  . Transportation needs    Medical: No    Non-medical: No  Tobacco Use  . Smoking status: Current Some Day Smoker    Types: Cigarettes  . Smokeless tobacco: Never Used  . Tobacco comment: Stats needs no support--only smokes once to twice monthly  Substance and Sexual Activity  . Alcohol use: No  . Drug use: No  . Sexual activity: Yes    Birth control/protection: Post-menopausal  Lifestyle  . Physical activity    Days per week: 0 days    Minutes per session: 0 min  . Stress: Rather much  Relationships  . Social Musicianconnections    Talks on phone: Not on file    Gets together: Not on file    Attends religious service: Not on file    Active member of club or organization: Not on file    Attends meetings of clubs or organizations: Not on file    Relationship status: Married  .  Intimate partner violence    Fear of current or ex partner: No    Emotionally abused: No    Physically abused: No    Forced sexual activity: No  Other Topics Concern  . Not on file  Social History Narrative   During pandemic, having some difficulty with bills as husband not working due to [employer] being closed--he cleans there.   Lives in [place] with husband and young son   Rest of children are adults and on their own      Review of Systems  Constitutional: Negative for appetite change, fatigue and fever.  HENT: Negative for ear pain, hearing loss, rhinorrhea and sore throat.   Eyes: Negative for visual disturbance.  Respiratory: Negative for cough and shortness of breath.   Cardiovascular: Negative for chest pain, palpitations and leg swelling.  Gastrointestinal: Negative for abdominal pain, blood in stool (No melena), constipation and diarrhea.  Genitourinary: Negative for dysuria  and menstrual problem (postmenopausal.  LMP at age 53).  Musculoskeletal: Negative for arthralgias.  Skin: Negative for rash.  Neurological: Negative for weakness and numbness.  Psychiatric/Behavioral: Negative for dysphoric mood. The patient is not nervous/anxious.       Objective:   BP 138/84 (BP Location: Left Arm, Patient Position: Sitting, Cuff Size: Normal)   Pulse 70   Temp 98.3 F (36.8 C)   Resp 12   Ht 5\' 4"  (1.626 m)   Wt 168 lb (76.2 kg)   BMI 28.84 kg/m   Physical Exam  Constitutional: She is oriented to person, place, and time. She appears well-developed and well-nourished.  HENT:  Head: Normocephalic and atraumatic.  Right Ear: Hearing, tympanic membrane, external ear and ear canal normal.  Left Ear: Hearing, tympanic membrane, external ear and ear canal normal.  Nose: Nose normal.  Mouth/Throat: Uvula is midline, oropharynx is clear and moist and mucous membranes are normal.  Eyes: Pupils are equal, round, and reactive to light. Conjunctivae and EOM are normal.  Discs sharp bilaterally  Neck: Normal range of motion and full passive range of motion without pain. Neck supple. Thyromegaly present. No thyroid mass present.  Cardiovascular: Normal rate, regular rhythm, S1 normal and S2 normal. Exam reveals no S3, no S4 and no friction rub.  No murmur heard. No carotid bruits.  Carotid, radial, femoral, DP and PT pulses normal and equal.   Pulmonary/Chest: Effort normal and breath sounds normal. Right breast exhibits no inverted nipple, no mass, no nipple discharge, no skin change and no tenderness. Left breast exhibits no inverted nipple, no mass, no nipple discharge, no skin change and no tenderness.  Abdominal: Soft. Bowel sounds are normal. She exhibits no mass. There is no hepatosplenomegaly. There is no abdominal tenderness. No hernia.  Genitourinary:    Genitourinary Comments: Normal external female genitalia.  Vaginal and cervical mucosa atrophic and mildly  friable.  Scant yellow proximal vaginal discharge, no odor.  No uterine or adnexal mass or tenderness. Rectal:  No mass, heme negative stool.      Musculoskeletal: Normal range of motion.  Lymphadenopathy:       Head (right side): No submental and no submandibular adenopathy present.       Head (left side): No submental and no submandibular adenopathy present.    She has no cervical adenopathy.    She has no axillary adenopathy.       Right: No inguinal and no supraclavicular adenopathy present.       Left: No inguinal and no supraclavicular adenopathy  present.  Neurological: She is alert and oriented to person, place, and time. She has normal strength and normal reflexes. No cranial nerve deficit or sensory deficit. Coordination and gait normal.  Skin: Skin is warm. No rash noted.  Psychiatric: She has a normal mood and affect. Her speech is normal and behavior is normal. Judgment and thought content normal. Cognition and memory are normal.    Assessment & Plan  1.  CPE with pap Guaiac Cards x 3 to return in 2 weeks. Wet prep support BV, but asymptomatic, so no treatment Referral for screening colonoscopy. Mammogram ordered CBC, CMP  2.  Tobacco abuse:  Discussed options for supportive treatment for cessation.  Nicotine gum or lozenges best option.  Quit Therapist, music given.  3.  Hyperlipidemia:  FLP  4.  Generous thyroid:  TSH  5.  Overweight:  Encouraged lifestyle changes with diet and physical activity.

## 2019-08-03 LAB — CBC WITH DIFFERENTIAL/PLATELET
Basophils Absolute: 0 10*3/uL (ref 0.0–0.2)
Basos: 1 %
EOS (ABSOLUTE): 0.2 10*3/uL (ref 0.0–0.4)
Eos: 3 %
Hematocrit: 40 % (ref 34.0–46.6)
Hemoglobin: 12.8 g/dL (ref 11.1–15.9)
Immature Grans (Abs): 0 10*3/uL (ref 0.0–0.1)
Immature Granulocytes: 0 %
Lymphocytes Absolute: 2.1 10*3/uL (ref 0.7–3.1)
Lymphs: 32 %
MCH: 29.8 pg (ref 26.6–33.0)
MCHC: 32 g/dL (ref 31.5–35.7)
MCV: 93 fL (ref 79–97)
Monocytes Absolute: 0.5 10*3/uL (ref 0.1–0.9)
Monocytes: 8 %
Neutrophils Absolute: 3.7 10*3/uL (ref 1.4–7.0)
Neutrophils: 56 %
Platelets: 239 10*3/uL (ref 150–450)
RBC: 4.29 x10E6/uL (ref 3.77–5.28)
RDW: 12.9 % (ref 11.7–15.4)
WBC: 6.5 10*3/uL (ref 3.4–10.8)

## 2019-08-03 LAB — COMPREHENSIVE METABOLIC PANEL
ALT: 13 IU/L (ref 0–32)
AST: 20 IU/L (ref 0–40)
Albumin/Globulin Ratio: 1.7 (ref 1.2–2.2)
Albumin: 4.3 g/dL (ref 3.8–4.9)
Alkaline Phosphatase: 86 IU/L (ref 39–117)
BUN/Creatinine Ratio: 24 — ABNORMAL HIGH (ref 9–23)
BUN: 16 mg/dL (ref 6–24)
Bilirubin Total: 0.6 mg/dL (ref 0.0–1.2)
CO2: 26 mmol/L (ref 20–29)
Calcium: 9.4 mg/dL (ref 8.7–10.2)
Chloride: 103 mmol/L (ref 96–106)
Creatinine, Ser: 0.67 mg/dL (ref 0.57–1.00)
GFR calc Af Amer: 118 mL/min/{1.73_m2} (ref >59)
GFR calc non Af Amer: 102 mL/min/{1.73_m2} (ref >59)
Globulin, Total: 2.6 g/dL (ref 1.5–4.5)
Glucose: 89 mg/dL (ref 65–99)
Potassium: 4.3 mmol/L (ref 3.5–5.2)
Sodium: 143 mmol/L (ref 134–144)
Total Protein: 6.9 g/dL (ref 6.0–8.5)

## 2019-08-03 LAB — LIPID PANEL W/O CHOL/HDL RATIO
Cholesterol, Total: 197 mg/dL (ref 100–199)
HDL: 46 mg/dL (ref >39)
LDL Calculated: 119 mg/dL — ABNORMAL HIGH (ref 0–99)
Triglycerides: 158 mg/dL — ABNORMAL HIGH (ref 0–149)
VLDL Cholesterol Cal: 32 mg/dL (ref 5–40)

## 2019-08-03 LAB — CYTOLOGY - PAP

## 2019-08-03 LAB — TSH: TSH: 1.4 u[IU]/mL (ref 0.450–4.500)

## 2019-09-08 ENCOUNTER — Encounter: Payer: Self-pay | Admitting: Internal Medicine

## 2019-10-30 ENCOUNTER — Encounter: Payer: Self-pay | Admitting: Internal Medicine

## 2019-11-02 DIAGNOSIS — E010 Iodine-deficiency related diffuse (endemic) goiter: Secondary | ICD-10-CM | POA: Insufficient documentation

## 2019-11-02 DIAGNOSIS — Z720 Tobacco use: Secondary | ICD-10-CM | POA: Insufficient documentation

## 2019-11-25 ENCOUNTER — Other Ambulatory Visit: Payer: Self-pay

## 2019-11-25 ENCOUNTER — Ambulatory Visit
Admission: RE | Admit: 2019-11-25 | Discharge: 2019-11-25 | Disposition: A | Discharge status: Home / Self Care | Payer: Medicaid Other | Source: Ambulatory Visit | Attending: Internal Medicine | Admitting: Internal Medicine

## 2019-11-25 DIAGNOSIS — Z1239 Encounter for other screening for malignant neoplasm of breast: Secondary | ICD-10-CM

## 2019-12-05 ENCOUNTER — Telehealth: Payer: Self-pay | Admitting: Internal Medicine

## 2019-12-05 NOTE — Telephone Encounter (Signed)
Patient called requesting Rx on Atorvastatin and Metoprolol to be called in at [facility].  Please advise

## 2019-12-13 IMAGING — MG DIGITAL SCREENING BILAT W/ TOMO W/ CAD
8 series · 8 of 24 positions shown · non-contrast
Comparison: This is the patient's baseline mammogram.

CLINICAL DATA: Screening.

EXAM:
DIGITAL SCREENING BILATERAL MAMMOGRAM WITH TOMO AND CAD

[L MLO synth-2D]
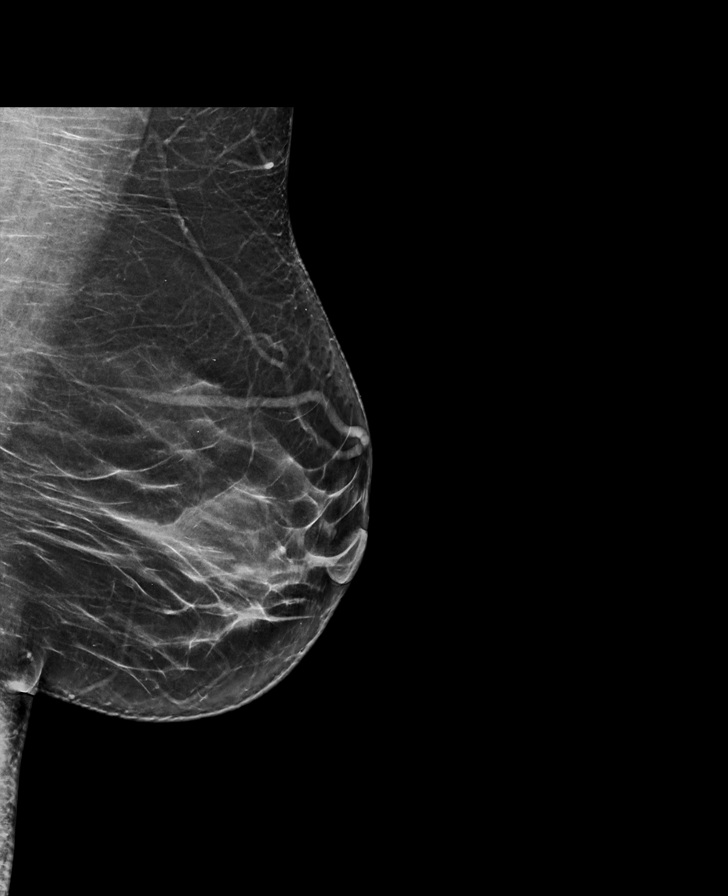

[R MLO synth-2D]
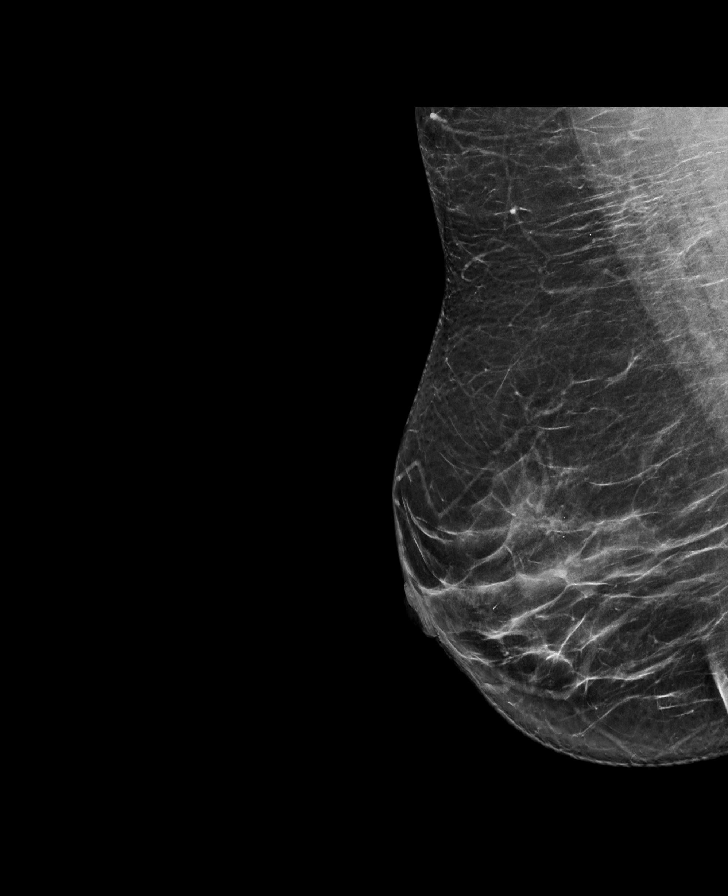

[R CC synth-2D]
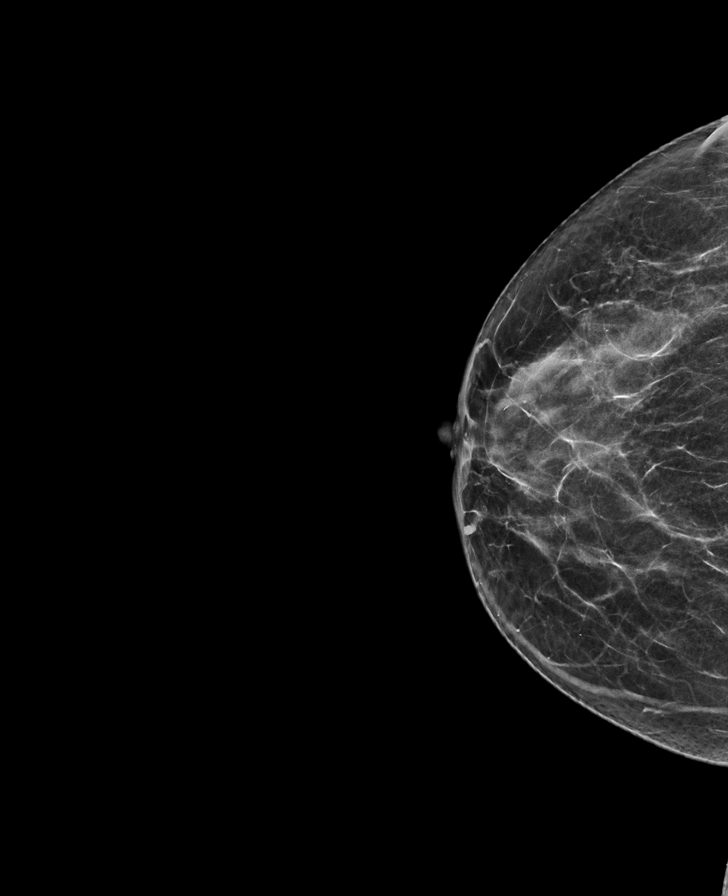

[L CC synth-2D]
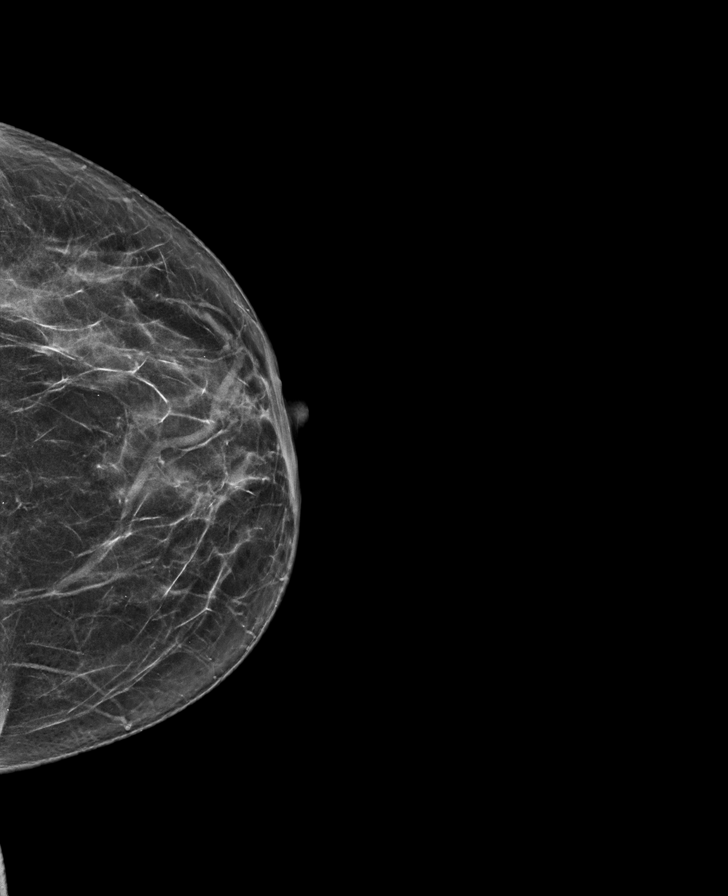

[L MLO tomo · tomo slice 39/76.0]
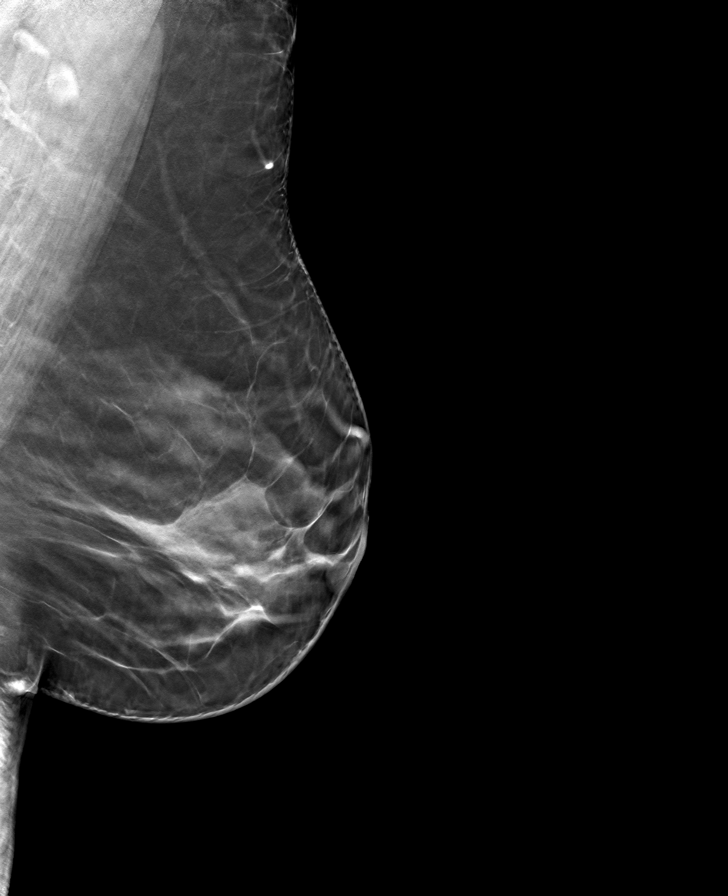

[R CC tomo · tomo slice 31/62.0]
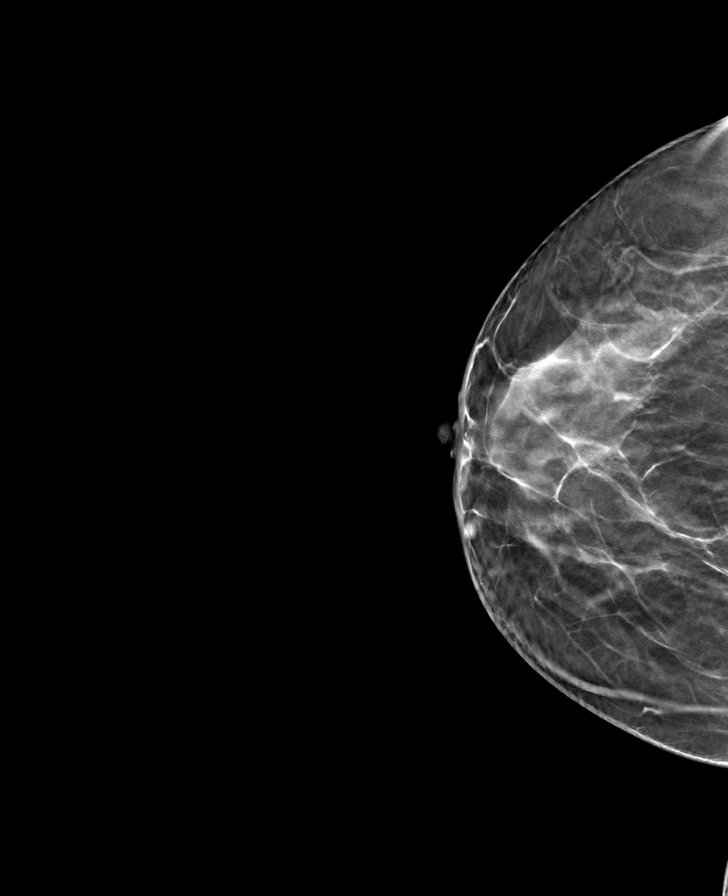

[L CC tomo · tomo slice 32/63.0]
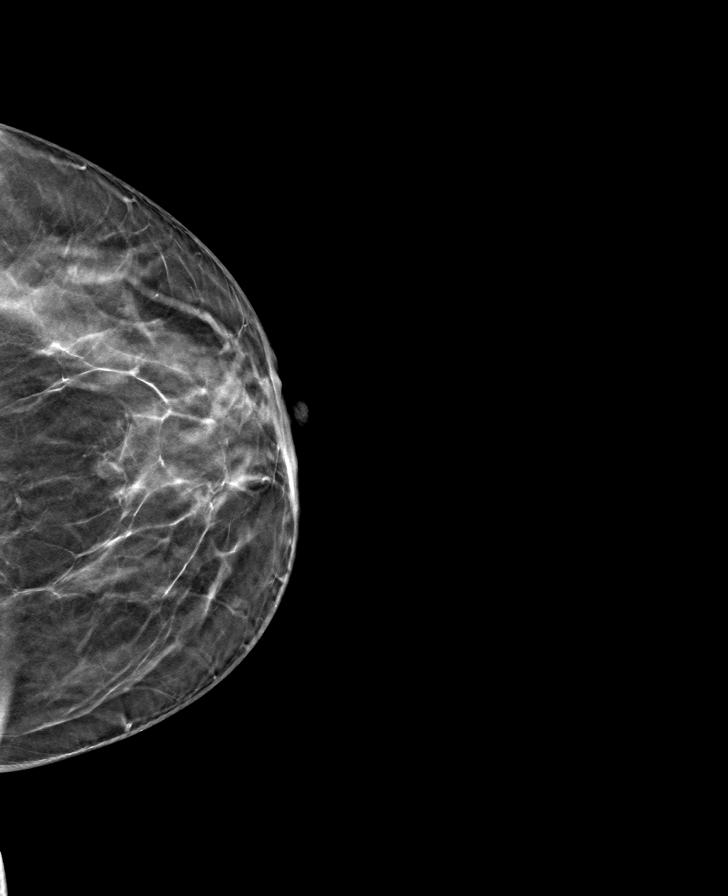

[R MLO tomo · tomo slice 37/73.0]
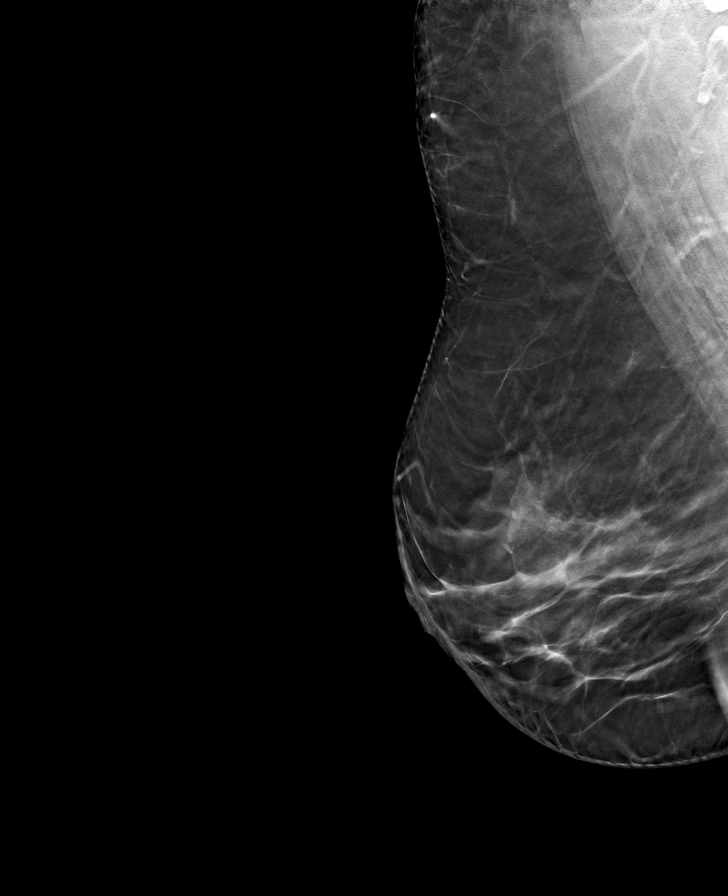

[8 of 24 positions shown; findings below may reference images not displayed]

ACR Breast Density Category c: The breast tissue is heterogeneously
dense, which may obscure small masses
FINDINGS: There are no findings suspicious for malignancy. Images were
processed with CAD.
IMPRESSION: No mammographic evidence of malignancy. A result letter of this
screening mammogram will be mailed directly to the patient.

RECOMMENDATION:
Screening mammogram in one year. (Code:AX-J-RND)

BI-RADS CATEGORY  1: Negative.

## 2019-12-14 MED ORDER — ATORVASTATIN CALCIUM 40 MG PO TABS: 40.0000 mg | ORAL_TABLET | Freq: Every day | ORAL | 11 refills | Status: DC

## 2019-12-14 MED ORDER — METOPROLOL TARTRATE 25 MG PO TABS: 25.0000 mg | ORAL_TABLET | Freq: Two times a day (BID) | ORAL | 11 refills | Status: DC

## 2020-02-02 ENCOUNTER — Other Ambulatory Visit: Payer: Self-pay

## 2020-02-02 ENCOUNTER — Ambulatory Visit: Payer: Medicaid Other | Admitting: Internal Medicine

## 2020-02-02 ENCOUNTER — Encounter: Payer: Self-pay | Admitting: Internal Medicine

## 2020-02-02 VITALS — BP 138/82 | HR 64 | Resp 12 | Ht 64.0 in | Wt 173.0 lb

## 2020-02-02 DIAGNOSIS — R0789 Other chest pain: Secondary | ICD-10-CM | POA: Diagnosis not present

## 2020-02-02 DIAGNOSIS — E782 Mixed hyperlipidemia: Secondary | ICD-10-CM

## 2020-02-02 DIAGNOSIS — I10 Essential (primary) hypertension: Secondary | ICD-10-CM | POA: Diagnosis not present

## 2020-02-02 MED ORDER — IBUPROFEN 200 MG PO TABS: ORAL_TABLET | ORAL | 0 refills | Status: DC

## 2020-02-02 MED ORDER — CYCLOBENZAPRINE HCL 5 MG PO TABS: ORAL_TABLET | ORAL | 0 refills | Status: DC

## 2020-02-02 MED ORDER — METOPROLOL TARTRATE 50 MG PO TABS: 50.0000 mg | ORAL_TABLET | Freq: Two times a day (BID) | ORAL | 11 refills | Status: DC

## 2020-02-02 NOTE — Patient Instructions (Addendum)
  [facility]:  [phone]   Oswaldo Done un vaso de agua antes de cada comida Tome un minimo de 6 a 8 vasos de agua diarios Coma tres veces al dia Coma Neomia Dear proteina y Neomia Dear grasa saludable con comida.  (huevos, pescado, pollo, pavo, y limite carnes rojas Coma 5 porciones diarias de legumbres.  Mezcle los colores Coma 2 porciones diarias de frutas con cascara cuando sea comestible Use platos pequeos Suelte su tenedor o cuchara despues de cada mordida hata que se mastique y se trague Come en la mesa con amigos o familiares por lo menos una vez al dia [place] la televisin y aparatos electrnicos durante la comida  Su objetivo debe ser perder una libra por semana  Estudios recientes indican que las personas quienes consumen todos de sus calorias durante 12 horas se bajan de pesocon Mas eficiencia.  Por ejemplo, si Usted come su primera comida a las 7:00 a.m., su comida final del dia se debe completar antes de las 7:00 p.m.  Caminar!!!
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
## 2020-02-03 LAB — LIPID PANEL W/O CHOL/HDL RATIO
Cholesterol, Total: 194 mg/dL (ref 100–199)
HDL: 41 mg/dL (ref >39)
LDL Chol Calc (NIH): 122 mg/dL — ABNORMAL HIGH (ref 0–99)
Triglycerides: 175 mg/dL — ABNORMAL HIGH (ref 0–149)
VLDL Cholesterol Cal: 31 mg/dL (ref 5–40)

## 2020-02-15 ENCOUNTER — Other Ambulatory Visit: Payer: Self-pay

## 2020-02-15 ENCOUNTER — Ambulatory Visit (INDEPENDENT_AMBULATORY_CARE_PROVIDER_SITE_OTHER): Payer: Medicaid Other

## 2020-02-15 VITALS — BP 124/80 | HR 60

## 2020-02-15 DIAGNOSIS — I10 Essential (primary) hypertension: Secondary | ICD-10-CM | POA: Diagnosis not present

## 2020-02-15 NOTE — Progress Notes (Signed)
Patient BP in normal range. Informed to continue current dose of medication. Patient verbalized understanding. 

## 2020-03-14 ENCOUNTER — Other Ambulatory Visit: Payer: Self-pay | Admitting: Internal Medicine

## 2020-03-14 MED ORDER — ATORVASTATIN CALCIUM 80 MG PO TABS: ORAL_TABLET | ORAL | 11 refills | Status: DC

## 2020-04-10 ENCOUNTER — Encounter (HOSPITAL_COMMUNITY): Payer: Self-pay | Admitting: Emergency Medicine

## 2020-04-10 ENCOUNTER — Emergency Department (HOSPITAL_COMMUNITY): Payer: Medicaid Other

## 2020-04-10 ENCOUNTER — Emergency Department (HOSPITAL_COMMUNITY)
Admission: EM | Admit: 2020-04-10 | Discharge: 2020-04-10 | Disposition: A | Discharge status: Home / Self Care | Payer: Medicaid Other | Attending: Emergency Medicine | Admitting: Emergency Medicine

## 2020-04-10 ENCOUNTER — Other Ambulatory Visit: Payer: Self-pay

## 2020-04-10 DIAGNOSIS — H81392 Other peripheral vertigo, left ear: Secondary | ICD-10-CM | POA: Diagnosis not present

## 2020-04-10 DIAGNOSIS — Z79899 Other long term (current) drug therapy: Secondary | ICD-10-CM | POA: Diagnosis not present

## 2020-04-10 DIAGNOSIS — Z87891 Personal history of nicotine dependence: Secondary | ICD-10-CM | POA: Diagnosis not present

## 2020-04-10 DIAGNOSIS — I10 Essential (primary) hypertension: Secondary | ICD-10-CM | POA: Diagnosis not present

## 2020-04-10 DIAGNOSIS — J0100 Acute maxillary sinusitis, unspecified: Secondary | ICD-10-CM | POA: Diagnosis not present

## 2020-04-10 DIAGNOSIS — R42 Dizziness and giddiness: Secondary | ICD-10-CM | POA: Diagnosis present

## 2020-04-10 LAB — URINALYSIS, ROUTINE W REFLEX MICROSCOPIC
Bilirubin Urine: NEGATIVE
Glucose, UA: NEGATIVE mg/dL
Ketones, ur: NEGATIVE mg/dL
Nitrite: NEGATIVE
Protein, ur: NEGATIVE mg/dL
Specific Gravity, Urine: 1.01 (ref 1.005–1.030)
pH: 6 (ref 5.0–8.0)

## 2020-04-10 LAB — BASIC METABOLIC PANEL
Anion gap: 8 (ref 5–15)
BUN: 12 mg/dL (ref 6–20)
CO2: 26 mmol/L (ref 22–32)
Calcium: 9.3 mg/dL (ref 8.9–10.3)
Chloride: 108 mmol/L (ref 98–111)
Creatinine, Ser: 0.55 mg/dL (ref 0.44–1.00)
GFR calc Af Amer: >60 mL/min (ref >60)
GFR calc non Af Amer: >60 mL/min (ref >60)
Glucose, Bld: 103 mg/dL — ABNORMAL HIGH (ref 70–99)
Potassium: 3.6 mmol/L (ref 3.5–5.1)
Sodium: 142 mmol/L (ref 135–145)

## 2020-04-10 LAB — CBC
HCT: 43 % (ref 36.0–46.0)
Hemoglobin: 13.7 g/dL (ref 12.0–15.0)
MCH: 29.6 pg (ref 26.0–34.0)
MCHC: 31.9 g/dL (ref 30.0–36.0)
MCV: 92.9 fL (ref 80.0–100.0)
Platelets: 261 10*3/uL (ref 150–400)
RBC: 4.63 MIL/uL (ref 3.87–5.11)
RDW: 12.7 % (ref 11.5–15.5)
WBC: 5.9 10*3/uL (ref 4.0–10.5)
nRBC: 0 % (ref 0.0–0.2)

## 2020-04-10 LAB — CBG MONITORING, ED: Glucose-Capillary: 84 mg/dL (ref 70–99)

## 2020-04-10 LAB — I-STAT BETA HCG BLOOD, ED (MC, WL, AP ONLY): I-stat hCG, quantitative: <5 m[IU]/mL (ref <5)

## 2020-04-10 MED ORDER — MECLIZINE HCL 50 MG PO TABS: 50.0000 mg | ORAL_TABLET | Freq: Three times a day (TID) | ORAL | 0 refills | Status: DC | PRN

## 2020-04-10 MED ORDER — PROCHLORPERAZINE EDISYLATE 10 MG/2ML IJ SOLN
10.0000 mg | Freq: Once | INTRAMUSCULAR | Status: DC
  Filled 2020-04-10: qty 2

## 2020-04-10 MED ORDER — MECLIZINE HCL 25 MG PO TABS
25.0000 mg | ORAL_TABLET | Freq: Once | ORAL | Status: AC
  Administered 2020-04-10: 22:00:00 25 mg via ORAL
  Filled 2020-04-10: qty 1

## 2020-04-10 MED ORDER — SODIUM CHLORIDE 0.9 % IV BOLUS: 1000.0000 mL | Freq: Once | INTRAVENOUS | Status: DC

## 2020-04-10 MED ORDER — DIPHENHYDRAMINE HCL 50 MG/ML IJ SOLN
25.0000 mg | Freq: Once | INTRAMUSCULAR | Status: DC
  Filled 2020-04-10: qty 1

## 2020-04-10 MED ORDER — AMOXICILLIN-POT CLAVULANATE 875-125 MG PO TABS: 1.0000 | ORAL_TABLET | Freq: Two times a day (BID) | ORAL | 0 refills | Status: AC

## 2020-04-10 NOTE — Discharge Instructions (Signed)
Please return for worsening dizziness or an inability to walk.  Also return for one-sided numbness or weakness difficulty with speech or swallowing.  Please call your family doctor tomorrow and let them know how you are doing.

## 2020-04-10 NOTE — ED Triage Notes (Signed)
Pt reports waking up around 5 am feeling dizzy today. Hypertensive in triage, took her meds when she became dizzy today. No weakness or facial droop noted, equal grip strengths.

## 2020-04-10 NOTE — ED Notes (Signed)
Pt transported to MRI 

## 2020-04-10 NOTE — ED Provider Notes (Signed)
[facility] EMERGENCY DEPARTMENT Provider Note   CSN: [id number] Arrival date & time: 04/10/20  1201     History Chief Complaint  Patient presents with  . Dizziness    Marie Ayala is a 54 y.o. female.  54 yo F with a chief complaint of dizziness.  She has difficulty describing this but it sounds like the world spins around her.  Happens when she turns her head or moves her eyes.  Been going on for about 12 hours now.  Will resolve if she holds her head still and closes her eyes.  Usually occurs fairly quickly.  She has a very mild headache.  She denies cough congestion or fever denies head injury or loss of consciousness denies neck pain.  She denies one-sided numbness or weakness denies difficulty with speech or swallowing.  Denies trauma.  Has had some significant difficulty walking.  The history is provided by the patient.  Dizziness Quality:  Head spinning Severity:  Severe Onset quality:  Sudden Duration:  12 hours Timing:  Constant Progression:  Waxing and waning Chronicity:  New Context: bending over and head movement   Relieved by:  Being still Worsened by:  Closing eyes, eye movement, sitting upright and turning head Ineffective treatments:  None tried Associated symptoms: headaches   Associated symptoms: no chest pain, no nausea, no palpitations, no shortness of breath and no vomiting        Past Medical History:  Diagnosis Date  . Essential hypertension 07/28/2017  . Hyperlipidemia 2019  . Hypertension 2016  . Overweight (BMI 25.0-29.9) 2020    Patient Active Problem List   Diagnosis Date Noted  . Thyromegaly 11/02/2019  . Tobacco abuse 11/02/2019  . Overweight (BMI 25.0-29.9) 2020  . Hyperlipidemia 2019  . Essential hypertension 07/28/2017  . Hypertension 2016    Past Surgical History:  Procedure Laterality Date  . CESAREAN SECTION  1991, 2001, 2012     OB History   No obstetric history on file.     Family  History  Problem Relation Age of Onset  . Hypertension Mother   . Heart disease Mother        MI  . Diabetes Mother     Social History   Tobacco Use  . Smoking status: Former Smoker    Types: Cigarettes  . Smokeless tobacco: Never Used  . Tobacco comment: Stats needs no support--only smokes once to twice monthly  Substance Use Topics  . Alcohol use: No  . Drug use: No    Home Medications Prior to Admission medications   Medication Sig Start Date End Date Taking? Authorizing Provider  atorvastatin (LIPITOR) 40 MG tablet Take 40 mg by mouth daily.  03/29/20  Yes [provider]  metoprolol tartrate (LOPRESSOR) 50 MG tablet Take 1 tablet (50 mg total) by mouth 2 (two) times daily. 02/02/20  Yes Julieanne Manson, MD  amoxicillin-clavulanate (AUGMENTIN) 875-125 MG tablet Take 1 tablet by mouth 2 (two) times daily for 7 days. 04/10/20 04/17/20  Melene Plan, DO  cyclobenzaprine (FLEXERIL) 5 MG tablet 1/2 to 1 tab by mouth at bedtime for right muscle pain Patient not taking: Reported on 04/10/2020 02/02/20   Julieanne Manson, MD  ibuprofen (ADVIL) 200 MG tablet 2-3 tabs by mouth with food twice daily for 14 days. Patient not taking: Reported on 04/10/2020 02/02/20   Julieanne Manson, MD  meclizine (ANTIVERT) 50 MG tablet Take 1 tablet (50 mg total) by mouth 3 (three) times daily as needed.  04/10/20   Melene Plan, DO    Allergies    Patient has no known allergies.  Review of Systems   Review of Systems  Constitutional: Negative for chills and fever.  HENT: Negative for congestion and rhinorrhea.   Eyes: Negative for redness and visual disturbance.  Respiratory: Negative for shortness of breath and wheezing.   Cardiovascular: Negative for chest pain and palpitations.  Gastrointestinal: Negative for nausea and vomiting.  Genitourinary: Negative for dysuria and urgency.  Musculoskeletal: Negative for arthralgias and myalgias.  Skin: Negative for pallor and wound.    Neurological: Positive for dizziness and headaches.    Physical Exam Updated Vital Signs BP (!) 153/86 (BP Location: Right Arm)   Pulse 66   Temp 98.4 F (36.9 C) (Oral)   Resp 18   Ht 5\' 2"  (1.575 m)   Wt 72.6 kg   SpO2 99%   BMI 29.26 kg/m   Physical Exam Vitals and nursing note reviewed.  Constitutional:      General: She is not in acute distress.    Appearance: She is well-developed. She is not diaphoretic.  HENT:     Head: Normocephalic and atraumatic.     Comments: Swollen turbinates, posterior nasal drip, effusion to bilateral TMs with some erythema.  No apparent bulging.  Maxillary sinus tenderness to percussion worst on the left  Eyes:     Pupils: Pupils are equal, round, and reactive to light.  Cardiovascular:     Rate and Rhythm: Normal rate and regular rhythm.     Heart sounds: No murmur. No friction rub. No gallop.   Pulmonary:     Effort: Pulmonary effort is normal.     Breath sounds: No wheezing or rales.  Abdominal:     General: There is no distension.     Palpations: Abdomen is soft.     Tenderness: There is no abdominal tenderness.  Musculoskeletal:        General: No tenderness.     Cervical back: Normal range of motion and neck supple.  Skin:    General: Skin is warm and dry.  Neurological:     Mental Status: She is alert and oriented to person, place, and time.     GCS: GCS eye subscore is 4. GCS verbal subscore is 5. GCS motor subscore is 6.     Cranial Nerves: Cranial nerves are intact.     Sensory: Sensation is intact.     Motor: Weakness present.     Coordination: Coordination is intact.     Comments: 4/5 muscle strength to the right upper extremity on extension of the tricep.  Left-sided fast going nystagmus  Psychiatric:        Behavior: Behavior normal.     ED Results / Procedures / Treatments   Labs (all labs ordered are listed, but only abnormal results are displayed) Labs Reviewed  BASIC METABOLIC PANEL - Abnormal; Notable  for the following components:      Result Value   Glucose, Bld 103 (*)    All other components within normal limits  URINALYSIS, ROUTINE W REFLEX MICROSCOPIC - Abnormal; Notable for the following components:   APPearance HAZY (*)    Hgb urine dipstick SMALL (*)    Leukocytes,Ua SMALL (*)    Bacteria, UA RARE (*)    All other components within normal limits  CBC  ETHANOL  CBG MONITORING, ED  I-STAT BETA HCG BLOOD, ED ([facility], [facility], [facility] ONLY)    EKG EKG Interpretation  Date/Time:  Tuesday April 10 2020 12:07:49 EDT Ventricular Rate:  64 PR Interval:  178 QRS Duration: 78 QT Interval:  386 QTC Calculation: 398 R Axis:   70 Text Interpretation: Normal sinus rhythm Normal ECG No significant change since last tracing Confirmed by Deno Etienne [phone]) on 04/10/2020 7:53:42 PM   Radiology MR BRAIN WO CONTRAST  Result Date: 04/10/2020 CLINICAL DATA:  Dizziness EXAM: MRI HEAD WITHOUT CONTRAST TECHNIQUE: Multiplanar, multiecho pulse sequences of the brain and surrounding structures were obtained without intravenous contrast. COMPARISON:  Brain MRI 07/22/2017 FINDINGS: BRAIN: No acute infarct, acute hemorrhage or extra-axial collection. Normal white matter signal for age. Normal volume of brain parenchyma and CSF spaces. Midline structures are normal. VASCULAR: Major flow voids are preserved. Susceptibility-sensitive sequences show no chronic microhemorrhage or superficial siderosis. SKULL AND UPPER CERVICAL SPINE: Normal calvarium and skull base. Visualized upper cervical spine and soft tissues are normal. SINUSES/ORBITS: No paranasal sinus fluid levels or advanced mucosal thickening. No mastoid or middle ear effusion. Normal orbits. IMPRESSION: Normal brain MRI. Electronically Signed   By: Ulyses Jarred M.D.   On: 04/10/2020 21:02    Procedures Procedures (including critical care time)  Medications Ordered in ED Medications  meclizine (ANTIVERT) tablet 25 mg (25 mg Oral Given 04/10/20 2130)     ED Course  I have reviewed the triage vital signs and the nursing notes.  Pertinent labs & imaging results that were available during my care of the patient were reviewed by me and considered in my medical decision making (see chart for details).    MDM Rules/Calculators/A&P                      54 yo F with a chief complaint of dizziness.  She has some difficulty describing this and there is some limitation secondary to a language barrier but it sounds like the patient has most likely BPPV based on her quick symptoms with head turning and then quick resolution with holding her head still.  She has some right upper extremity weakness that she attributes to pain to her right chest wall which was reproduced on exam.  She is still having some significant difficulty walking and due to the language barrier I have ordered an MRI.  We will treat her symptoms and reassess.  MRI is negative for acute pathology.  Patient is now ambulatory.  Will discharge home.  Requires meclizine.  Also with signs of sinusitis will treat with antibiotics.  10:50 PM:  I have discussed the diagnosis/risks/treatment options with the patient and family and believe the pt to be eligible for discharge home to follow-up with PCP. We also discussed returning to the ED immediately if new or worsening sx occur. We discussed the sx which are most concerning (e.g., one-sided numbness or weakness difficulty with speech or swallowing inability to walk) that necessitate immediate return. Medications administered to the patient during their visit and any new prescriptions provided to the patient are listed below.  Medications given during this visit Medications  meclizine (ANTIVERT) tablet 25 mg (25 mg Oral Given 04/10/20 2130)     The patient appears reasonably screen and/or stabilized for discharge and I doubt any other medical condition or other [facility] requiring further screening, evaluation, or treatment in the ED at this time  prior to discharge.   Final Clinical Impression(s) / ED Diagnoses Final diagnoses:  Peripheral vertigo involving left ear  Acute maxillary sinusitis, recurrence not specified    Rx / DC  Orders ED Discharge Orders         Ordered    meclizine (ANTIVERT) 50 MG tablet  3 times daily PRN     04/10/20 2124    amoxicillin-clavulanate (AUGMENTIN) 875-125 MG tablet  2 times daily     04/10/20 2124           Melene Plan, DO 04/10/20 2250

## 2020-04-10 NOTE — ED Notes (Signed)
Patient verbalizes understanding of discharge instructions. Opportunity for questioning and answers were provided. Armband removed by staff, pt discharged from ED.  

## 2020-05-03 ENCOUNTER — Ambulatory Visit: Payer: Medicaid Other | Admitting: Internal Medicine

## 2020-05-10 ENCOUNTER — Other Ambulatory Visit: Payer: Medicaid Other

## 2021-02-06 ENCOUNTER — Emergency Department (HOSPITAL_COMMUNITY)
Admission: EM | Admit: 2021-02-06 | Discharge: 2021-02-07 | Disposition: A | Discharge status: Home / Self Care | Payer: Medicaid Other | Attending: Emergency Medicine | Admitting: Emergency Medicine

## 2021-02-06 ENCOUNTER — Encounter (HOSPITAL_COMMUNITY): Payer: Self-pay

## 2021-02-06 ENCOUNTER — Other Ambulatory Visit: Payer: Self-pay

## 2021-02-06 DIAGNOSIS — R1110 Vomiting, unspecified: Secondary | ICD-10-CM | POA: Insufficient documentation

## 2021-02-06 DIAGNOSIS — Z5321 Procedure and treatment not carried out due to patient leaving prior to being seen by health care provider: Secondary | ICD-10-CM | POA: Diagnosis not present

## 2021-02-06 DIAGNOSIS — R42 Dizziness and giddiness: Secondary | ICD-10-CM | POA: Diagnosis not present

## 2021-02-06 DIAGNOSIS — R109 Unspecified abdominal pain: Secondary | ICD-10-CM | POA: Diagnosis present

## 2021-02-06 LAB — URINALYSIS, ROUTINE W REFLEX MICROSCOPIC
Bacteria, UA: NONE SEEN
Bilirubin Urine: NEGATIVE
Glucose, UA: NEGATIVE mg/dL
Ketones, ur: NEGATIVE mg/dL
Leukocytes,Ua: NEGATIVE
Nitrite: NEGATIVE
Protein, ur: NEGATIVE mg/dL
Specific Gravity, Urine: 1.012 (ref 1.005–1.030)
pH: 7 (ref 5.0–8.0)

## 2021-02-06 LAB — CBC
HCT: 41.5 % (ref 36.0–46.0)
Hemoglobin: 13.2 g/dL (ref 12.0–15.0)
MCH: 29.7 pg (ref 26.0–34.0)
MCHC: 31.8 g/dL (ref 30.0–36.0)
MCV: 93.3 fL (ref 80.0–100.0)
Platelets: 223 10*3/uL (ref 150–400)
RBC: 4.45 MIL/uL (ref 3.87–5.11)
RDW: 12.9 % (ref 11.5–15.5)
WBC: 7.4 10*3/uL (ref 4.0–10.5)
nRBC: 0 % (ref 0.0–0.2)

## 2021-02-06 LAB — COMPREHENSIVE METABOLIC PANEL
ALT: 18 U/L (ref 0–44)
AST: 21 U/L (ref 15–41)
Albumin: 4.1 g/dL (ref 3.5–5.0)
Alkaline Phosphatase: 87 U/L (ref 38–126)
Anion gap: 11 (ref 5–15)
BUN: 13 mg/dL (ref 6–20)
CO2: 24 mmol/L (ref 22–32)
Calcium: 9.3 mg/dL (ref 8.9–10.3)
Chloride: 100 mmol/L (ref 98–111)
Creatinine, Ser: 0.64 mg/dL (ref 0.44–1.00)
GFR, Estimated: >60 mL/min (ref >60)
Glucose, Bld: 119 mg/dL — ABNORMAL HIGH (ref 70–99)
Potassium: 4 mmol/L (ref 3.5–5.1)
Sodium: 135 mmol/L (ref 135–145)
Total Bilirubin: 1 mg/dL (ref 0.3–1.2)
Total Protein: 7.5 g/dL (ref 6.5–8.1)

## 2021-02-06 LAB — LIPASE, BLOOD: Lipase: 28 U/L (ref 11–51)

## 2021-02-06 LAB — I-STAT BETA HCG BLOOD, ED (MC, WL, AP ONLY): I-stat hCG, quantitative: <5 m[IU]/mL (ref <5)

## 2021-02-06 NOTE — ED Triage Notes (Signed)
Patient reports abdominal pain with vomiting and dizziness. States it started today around 11 am

## 2021-02-07 NOTE — ED Notes (Signed)
Pt left without communicating with staff.

## 2021-02-12 ENCOUNTER — Telehealth: Payer: Self-pay | Admitting: Internal Medicine

## 2021-02-12 NOTE — Telephone Encounter (Signed)
Patient called requesting an appointment.Patient stated that she is been feeling dizzy and is been loosing her balance when gets up  for the past 8-9 days. Patient went the ER but left without being seen. No other symptoms. Has not taking any medicine for it. Please advise.

## 2021-02-16 ENCOUNTER — Other Ambulatory Visit: Payer: Self-pay | Admitting: Internal Medicine

## 2021-02-21 NOTE — Telephone Encounter (Signed)
Patient's appointment was schedule for 07/19/2021.

## 2021-02-22 MED ORDER — METOPROLOL TARTRATE 50 MG PO TABS: 50.0000 mg | ORAL_TABLET | Freq: Two times a day (BID) | ORAL | 4 refills | Status: DC

## 2021-02-22 NOTE — Addendum Note (Signed)
Addended by: Marcene Duos on: 02/22/2021 09:45 AM   Modules accepted: Orders

## 2021-03-17 NOTE — Telephone Encounter (Signed)
Did she ever get offered and acute appt for this?  If not, please call and see if she is still having issues and get her in.

## 2021-03-25 NOTE — Telephone Encounter (Signed)
Patient stated that she is feeling better and she will just keep her appointment in July.

## 2021-04-04 ENCOUNTER — Telehealth: Payer: Self-pay | Admitting: Internal Medicine

## 2021-04-04 NOTE — Telephone Encounter (Signed)
Patient called requesting a refill for atorvastatin (LIPITOR) 40 MG tablet Patient has an appointment 07/19/2021.

## 2021-04-05 MED ORDER — ATORVASTATIN CALCIUM 40 MG PO TABS: 40.0000 mg | ORAL_TABLET | Freq: Every day | ORAL | 3 refills | Status: DC

## 2021-04-05 NOTE — Telephone Encounter (Signed)
sent 

## 2021-04-09 NOTE — Telephone Encounter (Signed)
Spoke with patient and she confirmed that was able to pick up her Rx.

## 2021-05-23 ENCOUNTER — Ambulatory Visit: Payer: Medicaid Other | Admitting: Internal Medicine

## 2021-07-19 ENCOUNTER — Ambulatory Visit: Payer: Medicaid Other | Admitting: Internal Medicine

## 2021-09-02 ENCOUNTER — Other Ambulatory Visit: Payer: Self-pay | Admitting: Internal Medicine

## 2021-09-05 NOTE — Telephone Encounter (Signed)
Pt is scheduled for 10/7 at 10:30am

## 2021-09-05 NOTE — Telephone Encounter (Signed)
He needs to make an appt before will refill--will then refill until the appt.  Has not been seen for more than 1 year, but we cancelled appt in July

## 2021-09-09 NOTE — Telephone Encounter (Signed)
SEnt twice-filled on one of them

## 2021-09-27 ENCOUNTER — Ambulatory Visit: Payer: Medicaid Other | Admitting: Internal Medicine

## 2021-09-27 ENCOUNTER — Other Ambulatory Visit: Payer: Self-pay

## 2021-09-27 ENCOUNTER — Encounter: Payer: Self-pay | Admitting: Internal Medicine

## 2021-09-27 VITALS — BP 144/84 | HR 64 | Resp 12 | Ht 64.0 in | Wt 167.0 lb

## 2021-09-27 DIAGNOSIS — E663 Overweight: Secondary | ICD-10-CM | POA: Diagnosis not present

## 2021-09-27 DIAGNOSIS — Z1231 Encounter for screening mammogram for malignant neoplasm of breast: Secondary | ICD-10-CM | POA: Diagnosis not present

## 2021-09-27 DIAGNOSIS — E782 Mixed hyperlipidemia: Secondary | ICD-10-CM

## 2021-09-27 DIAGNOSIS — I10 Essential (primary) hypertension: Secondary | ICD-10-CM | POA: Diagnosis not present

## 2021-09-27 DIAGNOSIS — R739 Hyperglycemia, unspecified: Secondary | ICD-10-CM

## 2021-09-27 MED ORDER — METOPROLOL TARTRATE 50 MG PO TABS
50.0000 mg | ORAL_TABLET | Freq: Two times a day (BID) | ORAL | 11 refills | Status: DC
Start: 2021-09-27 — End: 2023-11-20

## 2021-09-27 MED ORDER — ATORVASTATIN CALCIUM 40 MG PO TABS: 40.0000 mg | ORAL_TABLET | Freq: Every day | ORAL | 11 refills | Status: DC

## 2021-09-27 NOTE — Progress Notes (Signed)
    Subjective:    Patient ID: Marie Ayala, female   DOB: 1966-11-02, 55 y.o.   MRN: [id number]   HPI  Has not been seen in over 1 year.    Tildon Husky interprets   Hypertension:  Has been stretching out her medication as has not followed up.  States that has not been recent, however.    2.  Hyperglycemia in Feb 2022:  not clear when or where this was drawn--possibly in ED with abdominal complaints based on labs drawn.     3.  Hyperlipidemia: also ran out/stretched in July, but not since.  Atorvastatin.    4.  HM:  Has not had influenza vaccine, nor any COVID vaccines.  She cannot say she has had a tetanus vaccine.  No shingles vaccine.  She is very adverse to considering vaccination in generally.  States she has heard a lot of stories of people who are vaccinated that end up getting ill more often.  No information regarding vaccines would make her change her mind.  5.  Divorcing:  not interested in counseling  Current Meds  Medication Sig   atorvastatin (LIPITOR) 40 MG tablet Take 1 tablet by mouth once daily   metoprolol tartrate (LOPRESSOR) 50 MG tablet Take 1 tablet (50 mg total) by mouth 2 (two) times daily.   No Known Allergies   Review of Systems    Objective:   BP (!) 142/80 (BP Location: Left Arm, Patient Position: Sitting, Cuff Size: Normal)   Pulse 64   Resp 12   Ht 5\' 4"  (1.626 m)   Wt 167 lb (75.8 kg)   BMI 28.67 kg/m   Physical Exam NAD HEENT:  PERRL, EOMI, TMs pearly gray, throat without injection Neck:  Supple, No adenopathy, somewhat generous thyroid Chest:  CTA CV:  RRR with normal S1 and S2, No S3, S4 or murmur.  No carotid bruits.  Carotid, radial, DP/PT pulses normal and equal. Abd:  S NT, No HSM or mass, + BS LE:  No edema.   Assessment & Plan    Hypertension:  not controlled.  Metoprolol 50 mg twice daily.    2.  Hyperglycemia:  A1C  3.  Hyperlipidemia:  FLP.  Restart Atorvastatin.  4.  Obesity:  encouraged making small  incremental goals with diet and physical activity.    5.  HM:  Mammogram.  Recommended thinking about COvID, influenza, Tdap, Shingrix vaccines.

## 2021-09-27 NOTE — Patient Instructions (Addendum)
Recommend COVID, influenza, tetanus, Zoster (shingles) vaccines.

## 2021-09-28 LAB — CBC WITH DIFFERENTIAL/PLATELET
Basophils Absolute: 0 10*3/uL (ref 0.0–0.2)
Basos: 0 %
EOS (ABSOLUTE): 0.1 10*3/uL (ref 0.0–0.4)
Eos: 1 %
Hematocrit: 37.9 % (ref 34.0–46.6)
Hemoglobin: 12.6 g/dL (ref 11.1–15.9)
Immature Grans (Abs): 0 10*3/uL (ref 0.0–0.1)
Immature Granulocytes: 0 %
Lymphocytes Absolute: 1.7 10*3/uL (ref 0.7–3.1)
Lymphs: 30 %
MCH: 30.7 pg (ref 26.6–33.0)
MCHC: 33.2 g/dL (ref 31.5–35.7)
MCV: 92 fL (ref 79–97)
Monocytes Absolute: 0.5 10*3/uL (ref 0.1–0.9)
Monocytes: 9 %
Neutrophils Absolute: 3.4 10*3/uL (ref 1.4–7.0)
Neutrophils: 60 %
Platelets: 220 10*3/uL (ref 150–450)
RBC: 4.11 x10E6/uL (ref 3.77–5.28)
RDW: 12.7 % (ref 11.7–15.4)
WBC: 5.6 10*3/uL (ref 3.4–10.8)

## 2021-09-28 LAB — COMPREHENSIVE METABOLIC PANEL
ALT: 13 IU/L (ref 0–32)
AST: 13 IU/L (ref 0–40)
Albumin/Globulin Ratio: 1.7 (ref 1.2–2.2)
Albumin: 4.3 g/dL (ref 3.8–4.9)
Alkaline Phosphatase: 100 IU/L (ref 44–121)
BUN/Creatinine Ratio: 23 (ref 9–23)
BUN: 16 mg/dL (ref 6–24)
Bilirubin Total: 0.7 mg/dL (ref 0.0–1.2)
CO2: 23 mmol/L (ref 20–29)
Calcium: 9.3 mg/dL (ref 8.7–10.2)
Chloride: 107 mmol/L — ABNORMAL HIGH (ref 96–106)
Creatinine, Ser: 0.71 mg/dL (ref 0.57–1.00)
Globulin, Total: 2.6 g/dL (ref 1.5–4.5)
Glucose: 102 mg/dL — ABNORMAL HIGH (ref 70–99)
Potassium: 3.6 mmol/L (ref 3.5–5.2)
Sodium: 144 mmol/L (ref 134–144)
Total Protein: 6.9 g/dL (ref 6.0–8.5)
eGFR: 102 mL/min/{1.73_m2} (ref >59)

## 2021-09-28 LAB — LIPID PANEL W/O CHOL/HDL RATIO
Cholesterol, Total: 188 mg/dL (ref 100–199)
HDL: 46 mg/dL (ref >39)
LDL Chol Calc (NIH): 107 mg/dL — ABNORMAL HIGH (ref 0–99)
Triglycerides: 200 mg/dL — ABNORMAL HIGH (ref 0–149)
VLDL Cholesterol Cal: 35 mg/dL (ref 5–40)

## 2021-09-28 LAB — HGB A1C W/O EAG: Hgb A1c MFr Bld: 6 % — ABNORMAL HIGH (ref 4.8–5.6)

## 2021-10-29 ENCOUNTER — Ambulatory Visit: Payer: Medicaid Other

## 2021-11-18 ENCOUNTER — Other Ambulatory Visit (INDEPENDENT_AMBULATORY_CARE_PROVIDER_SITE_OTHER): Payer: Medicaid Other

## 2021-11-18 ENCOUNTER — Other Ambulatory Visit: Payer: Self-pay

## 2021-11-18 VITALS — BP 140/84 | HR 56

## 2021-11-18 DIAGNOSIS — Z0130 Encounter for examination of blood pressure without abnormal findings: Secondary | ICD-10-CM

## 2021-11-18 DIAGNOSIS — E782 Mixed hyperlipidemia: Secondary | ICD-10-CM | POA: Diagnosis not present

## 2021-11-18 MED ORDER — LISINOPRIL 10 MG PO TABS: 10.0000 mg | ORAL_TABLET | Freq: Every day | ORAL | 11 refills | Status: DC

## 2021-11-18 NOTE — Progress Notes (Signed)
Start Lisinopril 10 mg daily. BMP and BP check in 2 weeks. Call for problems/dry cough

## 2021-11-18 NOTE — Progress Notes (Signed)
Has been taking bp medication consistently. Took medication this morning.

## 2021-11-19 LAB — HEPATIC FUNCTION PANEL
ALT: 15 IU/L (ref 0–32)
AST: 18 IU/L (ref 0–40)
Albumin: 4.4 g/dL (ref 3.8–4.9)
Alkaline Phosphatase: 97 IU/L (ref 44–121)
Bilirubin Total: 0.7 mg/dL (ref 0.0–1.2)
Bilirubin, Direct: 0.17 mg/dL (ref 0.00–0.40)
Total Protein: 6.8 g/dL (ref 6.0–8.5)

## 2021-11-19 LAB — LIPID PANEL W/O CHOL/HDL RATIO
Cholesterol, Total: 157 mg/dL (ref 100–199)
HDL: 43 mg/dL (ref >39)
LDL Chol Calc (NIH): 98 mg/dL (ref 0–99)
Triglycerides: 86 mg/dL (ref 0–149)
VLDL Cholesterol Cal: 16 mg/dL (ref 5–40)

## 2021-12-02 ENCOUNTER — Other Ambulatory Visit (INDEPENDENT_AMBULATORY_CARE_PROVIDER_SITE_OTHER): Payer: Medicaid Other

## 2021-12-02 ENCOUNTER — Other Ambulatory Visit: Payer: Self-pay

## 2021-12-02 VITALS — BP 132/78 | HR 76

## 2021-12-02 DIAGNOSIS — Z79899 Other long term (current) drug therapy: Secondary | ICD-10-CM | POA: Diagnosis not present

## 2021-12-02 DIAGNOSIS — Z0130 Encounter for examination of blood pressure without abnormal findings: Secondary | ICD-10-CM

## 2021-12-02 NOTE — Progress Notes (Signed)
Pt reported that she is taking bp medication consistently. Took bp medication this morning

## 2021-12-03 LAB — BASIC METABOLIC PANEL
BUN/Creatinine Ratio: 28 — ABNORMAL HIGH (ref 9–23)
BUN: 21 mg/dL (ref 6–24)
CO2: 25 mmol/L (ref 20–29)
Calcium: 9.3 mg/dL (ref 8.7–10.2)
Chloride: 103 mmol/L (ref 96–106)
Creatinine, Ser: 0.75 mg/dL (ref 0.57–1.00)
Glucose: 99 mg/dL (ref 70–99)
Potassium: 4.6 mmol/L (ref 3.5–5.2)
Sodium: 141 mmol/L (ref 134–144)
eGFR: 95 mL/min/{1.73_m2} (ref >59)

## 2021-12-03 NOTE — Progress Notes (Signed)
CPM

## 2022-03-28 ENCOUNTER — Encounter: Payer: Medicaid Other | Admitting: Internal Medicine

## 2023-10-04 ENCOUNTER — Emergency Department (HOSPITAL_COMMUNITY): Payer: No Typology Code available for payment source

## 2023-10-04 ENCOUNTER — Encounter (HOSPITAL_COMMUNITY): Payer: Self-pay

## 2023-10-04 ENCOUNTER — Other Ambulatory Visit: Payer: Self-pay

## 2023-10-04 ENCOUNTER — Emergency Department (HOSPITAL_COMMUNITY)
Admission: EM | Admit: 2023-10-04 | Discharge: 2023-10-05 | Disposition: A | Discharge status: Home / Self Care | Payer: No Typology Code available for payment source | Attending: Emergency Medicine | Admitting: Emergency Medicine

## 2023-10-04 DIAGNOSIS — Z79899 Other long term (current) drug therapy: Secondary | ICD-10-CM | POA: Insufficient documentation

## 2023-10-04 DIAGNOSIS — M7918 Myalgia, other site: Secondary | ICD-10-CM | POA: Diagnosis not present

## 2023-10-04 DIAGNOSIS — R0789 Other chest pain: Secondary | ICD-10-CM | POA: Insufficient documentation

## 2023-10-04 DIAGNOSIS — M542 Cervicalgia: Secondary | ICD-10-CM | POA: Insufficient documentation

## 2023-10-04 DIAGNOSIS — Y92410 Unspecified street and highway as the place of occurrence of the external cause: Secondary | ICD-10-CM | POA: Insufficient documentation

## 2023-10-04 DIAGNOSIS — I10 Essential (primary) hypertension: Secondary | ICD-10-CM | POA: Insufficient documentation
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
## 2023-10-05 MED ORDER — LIDOCAINE 5 % EX PTCH
1.0000 | MEDICATED_PATCH | CUTANEOUS | Status: DC
  Administered 2023-10-05: 1 via TRANSDERMAL
  Filled 2023-10-05: qty 1
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
## 2023-10-23 DIAGNOSIS — I2510 Atherosclerotic heart disease of native coronary artery without angina pectoris: Secondary | ICD-10-CM | POA: Insufficient documentation

## 2023-11-09 NOTE — Progress Notes (Unsigned)
Closing this visit--patient canceled and was not seen

## 2023-11-20 ENCOUNTER — Encounter (HOSPITAL_COMMUNITY)
Admission: EM | Disposition: A | Discharge status: Home / Self Care | Payer: Self-pay | Source: Home / Self Care | Attending: Emergency Medicine

## 2023-11-20 ENCOUNTER — Other Ambulatory Visit: Payer: Self-pay

## 2023-11-20 ENCOUNTER — Emergency Department (HOSPITAL_COMMUNITY): Payer: Medicaid Other

## 2023-11-20 ENCOUNTER — Encounter (HOSPITAL_COMMUNITY): Payer: Self-pay

## 2023-11-20 ENCOUNTER — Observation Stay (HOSPITAL_COMMUNITY)
Admission: EM | Admit: 2023-11-20 | Discharge: 2023-11-21 | Disposition: A | Discharge status: Home / Self Care | Payer: Medicaid Other | Attending: Internal Medicine | Admitting: Internal Medicine

## 2023-11-20 ENCOUNTER — Observation Stay (HOSPITAL_COMMUNITY): Payer: Medicaid Other

## 2023-11-20 DIAGNOSIS — Z87891 Personal history of nicotine dependence: Secondary | ICD-10-CM | POA: Diagnosis not present

## 2023-11-20 DIAGNOSIS — I214 Non-ST elevation (NSTEMI) myocardial infarction: Secondary | ICD-10-CM | POA: Diagnosis not present

## 2023-11-20 DIAGNOSIS — E876 Hypokalemia: Secondary | ICD-10-CM | POA: Diagnosis not present

## 2023-11-20 DIAGNOSIS — I25110 Atherosclerotic heart disease of native coronary artery with unstable angina pectoris: Secondary | ICD-10-CM | POA: Diagnosis not present

## 2023-11-20 DIAGNOSIS — R7989 Other specified abnormal findings of blood chemistry: Secondary | ICD-10-CM | POA: Diagnosis not present

## 2023-11-20 DIAGNOSIS — E785 Hyperlipidemia, unspecified: Secondary | ICD-10-CM | POA: Diagnosis present

## 2023-11-20 DIAGNOSIS — Z720 Tobacco use: Secondary | ICD-10-CM | POA: Diagnosis present

## 2023-11-20 DIAGNOSIS — R0989 Other specified symptoms and signs involving the circulatory and respiratory systems: Principal | ICD-10-CM | POA: Insufficient documentation

## 2023-11-20 DIAGNOSIS — I10 Essential (primary) hypertension: Secondary | ICD-10-CM | POA: Diagnosis present

## 2023-11-20 DIAGNOSIS — R079 Chest pain, unspecified: Secondary | ICD-10-CM | POA: Diagnosis present

## 2023-11-20 HISTORY — PX: LEFT HEART CATH AND CORONARY ANGIOGRAPHY: CATH118249

## 2023-11-20 HISTORY — PX: CORONARY STENT INTERVENTION: CATH118234

## 2023-11-20 LAB — CBC WITH DIFFERENTIAL/PLATELET
Abs Immature Granulocytes: 0.04 10*3/uL (ref 0.00–0.07)
Basophils Absolute: 0.1 10*3/uL (ref 0.0–0.1)
Basophils Relative: 1 %
Eosinophils Absolute: 0.1 10*3/uL (ref 0.0–0.5)
Eosinophils Relative: 1 %
HCT: 41.6 % (ref 36.0–46.0)
Hemoglobin: 13.5 g/dL (ref 12.0–15.0)
Immature Granulocytes: 0 %
Lymphocytes Relative: 11 %
Lymphs Abs: 1.1 10*3/uL (ref 0.7–4.0)
MCH: 29.6 pg (ref 26.0–34.0)
MCHC: 32.5 g/dL (ref 30.0–36.0)
MCV: 91.2 fL (ref 80.0–100.0)
Monocytes Absolute: 0.2 10*3/uL (ref 0.1–1.0)
Monocytes Relative: 2 %
Neutro Abs: 8.4 10*3/uL — ABNORMAL HIGH (ref 1.7–7.7)
Neutrophils Relative %: 85 %
Platelets: 232 10*3/uL (ref 150–400)
RBC: 4.56 MIL/uL (ref 3.87–5.11)
RDW: 13.1 % (ref 11.5–15.5)
WBC: 9.8 10*3/uL (ref 4.0–10.5)
nRBC: 0 % (ref 0.0–0.2)

## 2023-11-20 LAB — BASIC METABOLIC PANEL
Anion gap: 9 (ref 5–15)
Anion gap: 9 (ref 5–15)
BUN: 17 mg/dL (ref 6–20)
BUN: 18 mg/dL (ref 6–20)
CO2: 24 mmol/L (ref 22–32)
CO2: 23 mmol/L (ref 22–32)
Calcium: 9.7 mg/dL (ref 8.9–10.3)
Calcium: 9.5 mg/dL (ref 8.9–10.3)
Chloride: 106 mmol/L (ref 98–111)
Chloride: 107 mmol/L (ref 98–111)
Creatinine, Ser: 0.67 mg/dL (ref 0.44–1.00)
Creatinine, Ser: 0.58 mg/dL (ref 0.44–1.00)
GFR, Estimated: >60 mL/min (ref >60)
GFR, Estimated: >60 mL/min (ref >60)
Glucose, Bld: 110 mg/dL — ABNORMAL HIGH (ref 70–99)
Glucose, Bld: 127 mg/dL — ABNORMAL HIGH (ref 70–99)
Potassium: 2.8 mmol/L — ABNORMAL LOW (ref 3.5–5.1)
Potassium: 3.5 mmol/L (ref 3.5–5.1)
Sodium: 139 mmol/L (ref 135–145)
Sodium: 139 mmol/L (ref 135–145)

## 2023-11-20 LAB — GLUCOSE, CAPILLARY
Glucose-Capillary: 156 mg/dL — ABNORMAL HIGH (ref 70–99)
Glucose-Capillary: 145 mg/dL — ABNORMAL HIGH (ref 70–99)

## 2023-11-20 LAB — CBC
HCT: 43.7 % (ref 36.0–46.0)
Hemoglobin: 14.1 g/dL (ref 12.0–15.0)
MCH: 29.7 pg (ref 26.0–34.0)
MCHC: 32.3 g/dL (ref 30.0–36.0)
MCV: 92.2 fL (ref 80.0–100.0)
Platelets: 246 10*3/uL (ref 150–400)
RBC: 4.74 MIL/uL (ref 3.87–5.11)
RDW: 13 % (ref 11.5–15.5)
WBC: 7.1 10*3/uL (ref 4.0–10.5)
nRBC: 0 % (ref 0.0–0.2)

## 2023-11-20 LAB — ECHOCARDIOGRAM COMPLETE
Area-P 1/2: 5.13 cm2
Height: 63 in
S' Lateral: 2.2 cm
Weight: 2560 [oz_av]

## 2023-11-20 LAB — POCT ACTIVATED CLOTTING TIME
Activated Clotting Time: 273 s
Activated Clotting Time: 343 s

## 2023-11-20 LAB — MAGNESIUM
Magnesium: 2.2 mg/dL (ref 1.7–2.4)
Magnesium: 2.2 mg/dL (ref 1.7–2.4)

## 2023-11-20 LAB — TROPONIN I (HIGH SENSITIVITY)
Troponin I (High Sensitivity): 99 ng/L — ABNORMAL HIGH (ref <18)
Troponin I (High Sensitivity): 713 ng/L (ref <18)

## 2023-11-20 SURGERY — LEFT HEART CATH AND CORONARY ANGIOGRAPHY
Anesthesia: LOCAL

## 2023-11-20 MED ORDER — POTASSIUM CHLORIDE CRYS ER 20 MEQ PO TBCR
40.0000 meq | EXTENDED_RELEASE_TABLET | Freq: Once | ORAL | Status: AC
  Administered 2023-11-20: 40 meq via ORAL
  Filled 2023-11-20: qty 2

## 2023-11-20 MED ORDER — FAMOTIDINE IN NACL 20-0.9 MG/50ML-% IV SOLN
20.0000 mg | Freq: Once | INTRAVENOUS | Status: AC
  Administered 2023-11-20: 20 mg via INTRAVENOUS
  Filled 2023-11-20: qty 50

## 2023-11-20 MED ORDER — DIPHENHYDRAMINE HCL 50 MG/ML IJ SOLN
25.0000 mg | Freq: Once | INTRAMUSCULAR | Status: AC
  Administered 2023-11-20: 25 mg via INTRAVENOUS
  Filled 2023-11-20: qty 1

## 2023-11-20 MED ORDER — VERAPAMIL HCL 2.5 MG/ML IV SOLN
INTRAVENOUS | Status: AC
  Filled 2023-11-20: qty 2

## 2023-11-20 MED ORDER — HEPARIN SODIUM (PORCINE) 1000 UNIT/ML IJ SOLN
INTRAMUSCULAR | Status: AC
  Filled 2023-11-20: qty 10

## 2023-11-20 MED ORDER — ROSUVASTATIN CALCIUM 5 MG PO TABS
10.0000 mg | ORAL_TABLET | Freq: Every day | ORAL | Status: DC
  Administered 2023-11-20: 10 mg via ORAL
  Filled 2023-11-20: qty 2

## 2023-11-20 MED ORDER — ASPIRIN 81 MG PO CHEW
324.0000 mg | CHEWABLE_TABLET | Freq: Once | ORAL | Status: AC
  Administered 2023-11-20: 324 mg via ORAL
  Filled 2023-11-20: qty 4

## 2023-11-20 MED ORDER — IOHEXOL 350 MG/ML SOLN
INTRAVENOUS | Status: DC | PRN
  Administered 2023-11-20: 150 mL via INTRA_ARTERIAL

## 2023-11-20 MED ORDER — FENTANYL CITRATE (PF) 100 MCG/2ML IJ SOLN
INTRAMUSCULAR | Status: DC | PRN
  Administered 2023-11-20 (×3): 25 ug via INTRAVENOUS

## 2023-11-20 MED ORDER — NITROGLYCERIN 1 MG/10 ML FOR IR/CATH LAB
INTRA_ARTERIAL | Status: DC | PRN
  Administered 2023-11-20: 200 ug via INTRA_ARTERIAL

## 2023-11-20 MED ORDER — HEPARIN (PORCINE) IN NACL 1000-0.9 UT/500ML-% IV SOLN
INTRAVENOUS | Status: DC | PRN
  Administered 2023-11-20 (×2): 500 mL

## 2023-11-20 MED ORDER — HEPARIN SODIUM (PORCINE) 1000 UNIT/ML IJ SOLN
INTRAMUSCULAR | Status: DC | PRN
  Administered 2023-11-20: 5000 [IU] via INTRAVENOUS
  Administered 2023-11-20: 3500 [IU] via INTRAVENOUS
  Administered 2023-11-20: 3000 [IU] via INTRAVENOUS

## 2023-11-20 MED ORDER — ASPIRIN 300 MG RE SUPP: 300.0000 mg | RECTAL | Status: DC

## 2023-11-20 MED ORDER — ATORVASTATIN CALCIUM 80 MG PO TABS
80.0000 mg | ORAL_TABLET | Freq: Every evening | ORAL | Status: DC
  Administered 2023-11-20: 80 mg via ORAL
  Filled 2023-11-20: qty 1

## 2023-11-20 MED ORDER — VERAPAMIL HCL 2.5 MG/ML IV SOLN
INTRAVENOUS | Status: DC | PRN
  Administered 2023-11-20: 10 mL via INTRA_ARTERIAL

## 2023-11-20 MED ORDER — SODIUM CHLORIDE 0.9 % WEIGHT BASED INFUSION
1.0000 mL/kg/h | INTRAVENOUS | Status: AC
  Administered 2023-11-20: 1 mL/kg/h via INTRAVENOUS

## 2023-11-20 MED ORDER — LIDOCAINE HCL (PF) 1 % IJ SOLN
INTRAMUSCULAR | Status: AC
  Filled 2023-11-20: qty 30

## 2023-11-20 MED ORDER — MIDAZOLAM HCL 2 MG/2ML IJ SOLN
INTRAMUSCULAR | Status: DC | PRN
  Administered 2023-11-20: 2 mg via INTRAVENOUS

## 2023-11-20 MED ORDER — METOPROLOL TARTRATE 5 MG/5ML IV SOLN: 5.0000 mg | INTRAVENOUS | Status: DC | PRN

## 2023-11-20 MED ORDER — METOPROLOL TARTRATE 12.5 MG HALF TABLET
12.5000 mg | ORAL_TABLET | Freq: Two times a day (BID) | ORAL | Status: DC
  Administered 2023-11-20 – 2023-11-21 (×3): 12.5 mg via ORAL
  Filled 2023-11-20 (×3): qty 1

## 2023-11-20 MED ORDER — LISINOPRIL 10 MG PO TABS
10.0000 mg | ORAL_TABLET | Freq: Every day | ORAL | Status: DC
  Administered 2023-11-20: 10 mg via ORAL
  Filled 2023-11-20: qty 1

## 2023-11-20 MED ORDER — NITROGLYCERIN 0.4 MG SL SUBL: 0.4000 mg | SUBLINGUAL_TABLET | SUBLINGUAL | Status: DC | PRN

## 2023-11-20 MED ORDER — HEPARIN (PORCINE) 25000 UT/250ML-% IV SOLN
900.0000 [IU]/h | INTRAVENOUS | Status: DC
  Administered 2023-11-20: 900 [IU]/h via INTRAVENOUS
  Filled 2023-11-20: qty 250

## 2023-11-20 MED ORDER — LIDOCAINE HCL (PF) 1 % IJ SOLN
INTRAMUSCULAR | Status: DC | PRN
  Administered 2023-11-20: 2 mL via INTRADERMAL

## 2023-11-20 MED ORDER — LISINOPRIL 5 MG PO TABS
5.0000 mg | ORAL_TABLET | Freq: Every day | ORAL | Status: DC
  Administered 2023-11-21: 5 mg via ORAL
  Filled 2023-11-20: qty 1

## 2023-11-20 MED ORDER — MAGNESIUM SULFATE IN D5W 1-5 GM/100ML-% IV SOLN: 1.0000 g | Freq: Once | INTRAVENOUS | Status: DC

## 2023-11-20 MED ORDER — ATORVASTATIN CALCIUM 40 MG PO TABS
80.0000 mg | ORAL_TABLET | Freq: Every day | ORAL | Status: DC
Start: 2023-11-20 — End: 2023-11-20
  Administered 2023-11-20: 80 mg via ORAL
  Filled 2023-11-20: qty 2

## 2023-11-20 MED ORDER — ONDANSETRON HCL 4 MG/2ML IJ SOLN: 4.0000 mg | Freq: Four times a day (QID) | INTRAMUSCULAR | Status: DC | PRN

## 2023-11-20 MED ORDER — ASPIRIN 81 MG PO CHEW: 324.0000 mg | CHEWABLE_TABLET | ORAL | Status: DC

## 2023-11-20 MED ORDER — TICAGRELOR 90 MG PO TABS
ORAL_TABLET | ORAL | Status: DC | PRN
  Administered 2023-11-20: 180 mg via ORAL

## 2023-11-20 MED ORDER — HEPARIN BOLUS VIA INFUSION
4000.0000 [IU] | Freq: Once | INTRAVENOUS | Status: AC
  Administered 2023-11-20: 4000 [IU] via INTRAVENOUS
  Filled 2023-11-20: qty 4000

## 2023-11-20 MED ORDER — METHYLPREDNISOLONE SODIUM SUCC 125 MG IJ SOLR
125.0000 mg | Freq: Once | INTRAMUSCULAR | Status: AC
  Administered 2023-11-20: 125 mg via INTRAVENOUS
  Filled 2023-11-20: qty 2

## 2023-11-20 MED ORDER — ASPIRIN 81 MG PO TBEC
81.0000 mg | DELAYED_RELEASE_TABLET | Freq: Every day | ORAL | Status: DC
Start: 2023-11-21 — End: 2023-11-21
  Administered 2023-11-21: 81 mg via ORAL
  Filled 2023-11-20: qty 1

## 2023-11-20 MED ORDER — SODIUM CHLORIDE 0.9 % WEIGHT BASED INFUSION: 3.0000 mL/kg/h | INTRAVENOUS | Status: AC

## 2023-11-20 MED ORDER — MIDAZOLAM HCL 2 MG/2ML IJ SOLN
INTRAMUSCULAR | Status: AC
Start: 2023-11-20 — End: ?
  Filled 2023-11-20: qty 2

## 2023-11-20 MED ORDER — TICAGRELOR 90 MG PO TABS
90.0000 mg | ORAL_TABLET | Freq: Two times a day (BID) | ORAL | Status: DC
Start: 2023-11-20 — End: 2023-11-21
  Administered 2023-11-20 – 2023-11-21 (×2): 90 mg via ORAL
  Filled 2023-11-20 (×2): qty 1

## 2023-11-20 MED ORDER — FENTANYL CITRATE (PF) 100 MCG/2ML IJ SOLN
INTRAMUSCULAR | Status: AC
  Filled 2023-11-20: qty 2

## 2023-11-20 MED ORDER — TICAGRELOR 90 MG PO TABS
ORAL_TABLET | ORAL | Status: AC
Start: 2023-11-20 — End: ?
  Filled 2023-11-20: qty 2

## 2023-11-20 MED ORDER — ACETAMINOPHEN 325 MG PO TABS
650.0000 mg | ORAL_TABLET | ORAL | Status: DC | PRN
Start: 2023-11-20 — End: 2023-11-21

## 2023-11-20 MED ORDER — SODIUM CHLORIDE 0.9 % WEIGHT BASED INFUSION
1.0000 mL/kg/h | INTRAVENOUS | Status: DC
Start: 2023-11-20 — End: 2023-11-20
  Administered 2023-11-20: 1 mL/kg/h via INTRAVENOUS

## 2023-11-20 SURGICAL SUPPLY — 18 items
BALLN SAPPHIRE 3.0X15 (BALLOONS) ×1
BALLN ~~LOC~~ EUPHORA RX 3.5X8 (BALLOONS) ×1
BALLOON SAPPHIRE 3.0X15 (BALLOONS) IMPLANT
BALLOON ~~LOC~~ EUPHORA RX 3.5X8 (BALLOONS) IMPLANT
CATH INFINITI 4FR 145 PIGTAIL (CATHETERS) IMPLANT
CATH INFINITI 5 FR JL3.5 (CATHETERS) IMPLANT
CATH INFINITI AMBI 5FR TG (CATHETERS) IMPLANT
CATH LAUNCHER 6FR EBU 3 (CATHETERS) IMPLANT
ELECT DEFIB PAD ADLT CADENCE (PAD) IMPLANT
GLIDESHEATH SLEND A-KIT 6F 22G (SHEATH) IMPLANT
GUIDEWIRE ANGLED .035X150CM (WIRE) IMPLANT
GUIDEWIRE INQWIRE 1.5J.035X260 (WIRE) IMPLANT
INQWIRE 1.5J .035X260CM (WIRE) ×1
KIT ENCORE 26 ADVANTAGE (KITS) IMPLANT
PACK CARDIAC CATHETERIZATION (CUSTOM PROCEDURE TRAY) ×1 IMPLANT
SET ATX-X65L (MISCELLANEOUS) IMPLANT
STENT ONYX FRONTIER 3.5X18 (Permanent Stent) IMPLANT
WIRE ASAHI PROWATER 180CM (WIRE) IMPLANT

## 2023-11-20 NOTE — ED Notes (Signed)
ED TO INPATIENT HANDOFF REPORT  ED Nurse Name and Phone #: Trinna Post, RN [phone]  S Name/Age/Gender [name] 57 y.o. female Room/Bed: 025C/025C  Code Status   Code Status: Not on file  Home/SNF/Other Home Patient oriented to: self, place, time, and situation Is this baseline? Yes   Triage Complete: Triage complete  Chief Complaint Feels like she's chocking  Triage Note Pt is coming in for a sensation of choking that has occurred since she ate some bread 1hr ago, she has no known allergies to food or medications. She also states it feels tight in her chest, with some palpitations, She also mentions having some shortness of breath.     Allergies No Known Allergies  Level of Care/Admitting Diagnosis ED Disposition     ED Disposition  Admit   Condition  --   Comment  The patient appears reasonably stabilized for admission considering the current resources, flow, and capabilities available in the ED at this time, and I doubt any other [facility] requiring further screening and/or treatment in the ED prior to admission is  present.          B Medical/Surgery History Past Medical History:  Diagnosis Date   Essential hypertension 07/28/2017   Hyperlipidemia 2019   Hypertension 2016   Overweight (BMI 25.0-29.9) 2020   Past Surgical History:  Procedure Laterality Date   CESAREAN SECTION  1991, 2001, 2012     A IV Location/Drains/Wounds Patient Lines/Drains/Airways Status     Active Line/Drains/Airways     Name Placement date Placement time Site Days   Peripheral IV 11/20/23 20 G Right Antecubital 11/20/23  0147  Antecubital  less than 1            Intake/Output Last 24 hours No intake or output data in the 24 hours ending 11/20/23 0303  Labs/Imaging Results for orders placed or performed during the hospital encounter of 11/20/23 (from the past 48 hour(s))  Basic metabolic panel     Status: Abnormal   Collection Time: 11/20/23  1:46 AM  Result  Value Ref Range   Sodium 139 135 - 145 mmol/L   Potassium 2.8 (L) 3.5 - 5.1 mmol/L   Chloride 106 98 - 111 mmol/L   CO2 24 22 - 32 mmol/L   Glucose, Bld 110 (H) 70 - 99 mg/dL    Comment: Glucose reference range applies only to samples taken after fasting for at least 8 hours.   BUN 17 6 - 20 mg/dL   Creatinine, Ser 9.60 0.44 - 1.00 mg/dL   Calcium 9.7 8.9 - 45.4 mg/dL   GFR, Estimated >09 >81 mL/min    Comment: (NOTE) Calculated using the CKD-EPI Creatinine Equation (2021)    Anion gap 9 5 - 15    Comment: Performed at [facility], [address], [place], [place] [address]  CBC     Status: None   Collection Time: 11/20/23  1:46 AM  Result Value Ref Range   WBC 7.1 4.0 - 10.5 K/uL   RBC 4.74 3.87 - 5.11 MIL/uL   Hemoglobin 14.1 12.0 - 15.0 g/dL   HCT 82.9 56.2 - 13.0 %   MCV 92.2 80.0 - 100.0 fL   MCH 29.7 26.0 - 34.0 pg   MCHC 32.3 30.0 - 36.0 g/dL   RDW 86.5 78.4 - 69.6 %   Platelets 246 150 - 400 K/uL   nRBC 0.0 0.0 - 0.2 %    Comment: Performed at [facility],  [address], [place], [place] [address]  Troponin I (High Sensitivity)     Status: Abnormal   Collection Time: 11/20/23  1:46 AM  Result Value Ref Range   Troponin I (High Sensitivity) 99 (H) <18 ng/L    Comment: (NOTE) Elevated high sensitivity troponin I (hsTnI) values and significant  changes across serial measurements may suggest ACS but many other  chronic and acute conditions are known to elevate hsTnI results.  Refer to the "Links" section for chest pain algorithms and additional  guidance. Performed at [facility], [address], [place], [place] [address]    DG Neck Soft Tissue  Result Date: 11/20/2023 CLINICAL DATA:  Choking EXAM: NECK SOFT TISSUES - 1+ VIEW COMPARISON:  None Available. FINDINGS: There is no evidence of retropharyngeal soft tissue swelling or epiglottic enlargement. The cervical airway is unremarkable and no radio-opaque foreign body identified. IMPRESSION:  Negative. Electronically Signed   By: Helyn Numbers M.D.   On: 11/20/2023 02:23   DG Chest 2 View  Result Date: 11/20/2023 CLINICAL DATA:  Choking, chest pain EXAM: CHEST - 2 VIEW COMPARISON:  None Available. FINDINGS: Minimal left basilar scarring. 9 mm nodular density noted within the right upper lung zone. Lungs are otherwise clear. No pneumothorax or pleural effusion. Cardiac size within normal limits. Pulmonary vascularity is normal. Osseous structures are age-appropriate. IMPRESSION: 1. No active cardiopulmonary disease. 2. 9 mm nodular density within the right upper lung zone. This could be better assessed with dedicated nonemergent chest CT. Electronically Signed   By: Helyn Numbers M.D.   On: 11/20/2023 02:22    Pending Labs Unresulted Labs (From admission, onward)     Start     Ordered   11/20/23 0247  Magnesium  Once,   STAT        11/20/23 0246            Vitals/Pain Today's Vitals   11/20/23 0147 11/20/23 0147 11/20/23 0247 11/20/23 0248  BP: (!) 179/101  (!) 186/100   Pulse: 72  83 73  Resp: 17  17 13   Temp:      SpO2: 100%  100% 100%  Weight:  72.6 kg    Height:  5\' 3"  (1.6 m)    PainSc:        Isolation Precautions No active isolations  Medications Medications  famotidine (PEPCID) IVPB 20 mg premix (0 mg Intravenous Hold 11/20/23 0246)  methylPREDNISolone sodium succinate (SOLU-MEDROL) 125 mg/2 mL injection 125 mg (125 mg Intravenous Given 11/20/23 0152)  diphenhydrAMINE (BENADRYL) injection 25 mg (25 mg Intravenous Given 11/20/23 0152)  aspirin chewable tablet 324 mg (324 mg Oral Given 11/20/23 0249)  potassium chloride SA (KLOR-CON M) CR tablet 40 mEq (40 mEq Oral Given 11/20/23 0250)    Mobility walks     Focused Assessments     R Recommendations: See Admitting Provider Note  Report given to:   Additional Notes:

## 2023-11-20 NOTE — H&P (View-Only) (Signed)
   Patient Name: Marie Ayala Date of Encounter: 11/20/2023 [facility] Cardiologist: None   Interval Summary  .    Still complaining of a feeling of tightness in her throat.  Denies chest pain.  Vital Signs .    Vitals:   11/20/23 0600 11/20/23 0630 11/20/23 0722 11/20/23 0724  BP: 118/71 104/64 122/80   Pulse: 71 72 78   Resp: 19 19 16    Temp:   97.7 F (36.5 C)   TempSrc:   Oral   SpO2: 100% 99% 100% 100%  Weight:      Height:       No intake or output data in the 24 hours ending 11/20/23 0821    11/20/2023    1:47 AM 10/04/2023    9:50 PM 09/27/2021   10:40 AM  Last 3 Weights  Weight (lbs) 160 lb 155 lb 167 lb  Weight (kg) 72.576 kg 70.308 kg 75.751 kg      Telemetry/ECG    Sinus rhythm with no arrhythmia- Personally Reviewed  Physical Exam .   GEN: No acute distress.   Neck: No JVD Cardiac: RRR, no murmurs, rubs, or gallops.  Respiratory: Clear to auscultation bilaterally. GI: Soft, nontender, non-distended  MS: No edema  Assessment & Plan .     Elevated troponin, occurred in setting of a 'choking sensation' after eating. Treated initially with steroids, pepcid, and benadryl. However, she then had uptrending HS-trop 99---->713. Now on IV heparin for treatment of ACS/NSTEMI.  HTN, untreated as outpatient.  Now on lisinopril and metoprolol. Mixed hyperlipidemia: LDL 98.  On rosuvastatin 10 mg daily.  Intensify of significant CAD.  Difficult situation and I am unclear if her symptoms could represent ACS or whether they are noncardiac related to some esophageal pathology.  She does have significant underlying risk factors and with her high-sensitivity troponin increasing from 99-713, I think definitive evaluation with cardiac catheterization is indicated. I have reviewed the risks, indications, and alternatives to cardiac catheterization, possible angioplasty, and stenting with the patient. Risks include but are not limited to bleeding,  infection, vascular injury, stroke, myocardial infection, arrhythmia, kidney injury, radiation-related injury in the case of prolonged fluoroscopy use, emergency cardiac surgery, and death. The patient understands the risks of serious complication is 1-2 in 1000 with diagnostic cardiac cath and 1-2% or less with angioplasty/stenting.  Patient and daughter understand and they are agreeable to proceed.  Continue IV heparin.  Follow-up after catheterization.  For questions or updates, please contact [facility] Please consult [email or web address] for contact info under        Signed, Tonny Bollman, MD

## 2023-11-20 NOTE — Progress Notes (Signed)
[facility] PROGRESS NOTE    Progress Note  Marixsa Collen  BMW:[id number] DOB: October 07, 1966 DOA: 11/20/2023 PCP: Department, [facility]     Brief Narrative:   Marie Ayala is an 57 y.o. female past medical history significant for essential hypertension hyperlipidemia stopped taking his medication about a year ago developed shortness of breath and chest pain along with neck discomfort.  Potassium 2.8 twelve-lead EKG showed no ST segment elevation, troponins of doubled. Started on IV heparin drip cardiology was consulted.   Assessment/Plan:   Elevated troponin Cycle cardiac enzymes check a 2D echo. Continue aspirin.  he is currently on heparin.  Start beta-blockers. Her story sounds more atypical chest pain. No family history started smoking about 5 years ago. Noncompliant with her hypertension medication. Has not had any more chest pain in house.  Essential hypertension Blood pressure stable.  Hyperlipidemia: Start Crestor.  Tobacco abuse: Counseling.  Hyperglycemia: CBC IV Solu-Medrol.     DVT prophylaxis: lovenox Family Communication:none Status is: Observation The patient remains OBS appropriate and will d/c before 2 midnights.    Code Status:     Code Status Orders  (From admission, onward)           Start     Ordered   11/20/23 0436  Full code  Continuous       Question:  By:  Answer:  Consent: discussion documented in EHR   11/20/23 0436           Code Status History     This patient has a current code status but no historical code status.         IV Access:   Peripheral IV   Procedures and diagnostic studies:   DG Neck Soft Tissue  Result Date: 11/20/2023 CLINICAL DATA:  Choking EXAM: NECK SOFT TISSUES - 1+ VIEW COMPARISON:  None Available. FINDINGS: There is no evidence of retropharyngeal soft tissue swelling or epiglottic enlargement. The cervical airway is unremarkable and no  radio-opaque foreign body identified. IMPRESSION: Negative. Electronically Signed   By: Helyn Numbers M.D.   On: 11/20/2023 02:23   DG Chest 2 View  Result Date: 11/20/2023 CLINICAL DATA:  Choking, chest pain EXAM: CHEST - 2 VIEW COMPARISON:  None Available. FINDINGS: Minimal left basilar scarring. 9 mm nodular density noted within the right upper lung zone. Lungs are otherwise clear. No pneumothorax or pleural effusion. Cardiac size within normal limits. Pulmonary vascularity is normal. Osseous structures are age-appropriate. IMPRESSION: 1. No active cardiopulmonary disease. 2. 9 mm nodular density within the right upper lung zone. This could be better assessed with dedicated nonemergent chest CT. Electronically Signed   By: Helyn Numbers M.D.   On: 11/20/2023 02:22     Medical Consultants:   None.   Subjective:    Brayleigh Krisy Balkema denies any chest pain or shortness of breath  Objective:    Vitals:   11/20/23 0433 11/20/23 0500 11/20/23 0530 11/20/23 0600  BP:  132/76 139/87 118/71  Pulse: 66 71 78 71  Resp: 17 18 10 19   Temp:   98.1 F (36.7 C)   TempSrc:   Oral   SpO2: 100% 100% 100% 100%  Weight:      Height:       SpO2: 100 %  No intake or output data in the 24 hours ending 11/20/23 0652 Filed Weights   11/20/23 0147  Weight: 72.6 kg    Exam: General exam: In no acute distress. Respiratory system: Good  air movement and clear to auscultation. Cardiovascular system: S1 & S2 heard, RRR. No JVD. Gastrointestinal system: Abdomen is nondistended, soft and nontender.  Extremities: No pedal edema. Skin: No rashes, lesions or ulcers Psychiatry: Judgement and insight appear normal. Mood & affect appropriate.    Data Reviewed:    Labs: Basic Metabolic Panel: Recent Labs  Lab 11/20/23 0146 11/20/23 0302  NA 139  --   K 2.8*  --   CL 106  --   CO2 24  --   GLUCOSE 110*  --   BUN 17  --   CREATININE 0.67  --   CALCIUM 9.7  --   MG  --  2.2    GFR Estimated Creatinine Clearance: 75.9 mL/min (by C-G formula based on SCr of 0.67 mg/dL). Liver Function Tests: No results for input(s): "AST", "ALT", "ALKPHOS", "BILITOT", "PROT", "ALBUMIN" in the last 168 hours. No results for input(s): "LIPASE", "AMYLASE" in the last 168 hours. No results for input(s): "AMMONIA" in the last 168 hours. Coagulation profile No results for input(s): "INR", "PROTIME" in the last 168 hours. COVID-19 Labs  No results for input(s): "DDIMER", "FERRITIN", "LDH", "CRP" in the last 72 hours.  No results found for: "SARSCOV2NAA"  CBC: Recent Labs  Lab 11/20/23 0146 11/20/23 0532  WBC 7.1 9.8  NEUTROABS  --  8.4*  HGB 14.1 13.5  HCT 43.7 41.6  MCV 92.2 91.2  PLT 246 232   Cardiac Enzymes: No results for input(s): "CKTOTAL", "CKMB", "CKMBINDEX", "TROPONINI" in the last 168 hours. BNP (last 3 results) No results for input(s): "PROBNP" in the last 8760 hours. CBG: No results for input(s): "GLUCAP" in the last 168 hours. D-Dimer: No results for input(s): "DDIMER" in the last 72 hours. Hgb A1c: No results for input(s): "HGBA1C" in the last 72 hours. Lipid Profile: No results for input(s): "CHOL", "HDL", "LDLCALC", "TRIG", "CHOLHDL", "LDLDIRECT" in the last 72 hours. Thyroid function studies: No results for input(s): "TSH", "T4TOTAL", "T3FREE", "THYROIDAB" in the last 72 hours.  Invalid input(s): "FREET3" Anemia work up: No results for input(s): "VITAMINB12", "FOLATE", "FERRITIN", "TIBC", "IRON", "RETICCTPCT" in the last 72 hours. Sepsis Labs: Recent Labs  Lab 11/20/23 0146 11/20/23 0532  WBC 7.1 9.8   Microbiology No results found for this or any previous visit (from the past 240 hour(s)).   Medications:    [START ON 11/21/2023] aspirin EC  81 mg Oral Daily   atorvastatin  80 mg Oral Daily   lisinopril  5 mg Oral Daily   Continuous Infusions:  heparin 900 Units/hr (11/20/23 0453)      LOS: 0 days   Marinda Elk  [facility]  11/20/2023, 6:52 AM

## 2023-11-20 NOTE — Interval H&P Note (Signed)
History and Physical Interval Note:  11/20/2023 11:20 AM  Marie Ayala  has presented today for surgery, with the diagnosis of NSTEMI.  The various methods of treatment have been discussed with the patient and family. After consideration of risks, benefits and other options for treatment, the patient has consented to  Procedure(s): LEFT HEART CATH AND CORONARY ANGIOGRAPHY (N/A) and possible coronary angioplasty  as a surgical intervention.  The patient's history has been reviewed, patient examined, no change in status, stable for surgery.  I have reviewed the patient's chart and labs.  Questions were answered to the patient's satisfaction.   Cath Lab Visit (complete for each Cath Lab visit)  Clinical Evaluation Leading to the Procedure:   ACS: Yes.    Non-ACS:    Anginal Classification: CCS IV  Anti-ischemic medical therapy: No Therapy  Non-Invasive Test Results: No non-invasive testing performed  Prior CABG: No previous CABG  Yates Decamp

## 2023-11-20 NOTE — Progress Notes (Signed)
  Echocardiogram 2D Echocardiogram has been performed.  Delcie Roch 11/20/2023, 8:52 AM

## 2023-11-20 NOTE — ED Provider Notes (Signed)
[place] EMERGENCY DEPARTMENT AT [facility] Provider Note   CSN: [id number] Arrival date & time: 11/20/23  0128     History  Chief Complaint  Patient presents with   Choking sensation    Marie Ayala is a 57 y.o. female.  The history is provided by the patient and medical records.   57 y.o. F with HTN, HLP, presenting to the ED for choking sensation.  Patient reports she ate some bread about 1 hour ago and started feeling like she was suffocating.  Feels like throat is closing and she is feeling SOB.  No fever/chills.  States she has eaten this before without issues.  No known allergies.  No meds PTA.  States she is taking home meds as directed.  She denies any exertional chest pain, no issues with activity, etc.  Denies drug or alcohol use.  No fever/chills.  Home Medications Prior to Admission medications   Medication Sig Start Date End Date Taking? Authorizing Provider  atorvastatin (LIPITOR) 40 MG tablet Take 1 tablet (40 mg total) by mouth daily. 09/27/21   Julieanne Manson, MD  lisinopril (ZESTRIL) 10 MG tablet Take 1 tablet (10 mg total) by mouth daily. 11/18/21   Julieanne Manson, MD  metoprolol tartrate (LOPRESSOR) 50 MG tablet Take 1 tablet (50 mg total) by mouth 2 (two) times daily. 09/27/21   Julieanne Manson, MD      Allergies    Patient has no known allergies.    Review of Systems   Review of Systems  Respiratory:  Positive for shortness of breath.   All other systems reviewed and are negative.   Physical Exam Updated Vital Signs BP (!) 190/122   Pulse 77   Temp (!) 97.4 F (36.3 C)   Resp 18   SpO2 100%   Physical Exam Vitals and nursing note reviewed.  Constitutional:      Appearance: She is well-developed.  HENT:     Head: Normocephalic and atraumatic.     Mouth/Throat:     Comments: No lip/tongue swelling, handling secretions, no stridor Eyes:     Conjunctiva/sclera: Conjunctivae normal.     Pupils: Pupils  are equal, round, and reactive to light.  Cardiovascular:     Rate and Rhythm: Normal rate and regular rhythm.     Heart sounds: Normal heart sounds.  Pulmonary:     Effort: Pulmonary effort is normal. No respiratory distress.     Breath sounds: Normal breath sounds. No wheezing or rhonchi.     Comments: No audible wheezes, no distress, sats 100% on RA Abdominal:     General: Bowel sounds are normal.     Palpations: Abdomen is soft.     Tenderness: There is no abdominal tenderness. There is no rebound.  Musculoskeletal:        General: Normal range of motion.     Cervical back: Normal range of motion.  Skin:    General: Skin is warm and dry.  Neurological:     Mental Status: She is alert and oriented to person, place, and time.     ED Results / Procedures / Treatments   Labs (all labs ordered are listed, but only abnormal results are displayed) Labs Reviewed  BASIC METABOLIC PANEL - Abnormal; Notable for the following components:      Result Value   Potassium 2.8 (*)    Glucose, Bld 110 (*)    All other components within normal limits  TROPONIN I (HIGH SENSITIVITY) - Abnormal;  Notable for the following components:   Troponin I (High Sensitivity) 99 (*)    All other components within normal limits  TROPONIN I (HIGH SENSITIVITY) - Abnormal; Notable for the following components:   Troponin I (High Sensitivity) 713 (*)    All other components within normal limits  CBC  MAGNESIUM  HIV ANTIBODY (ROUTINE TESTING W REFLEX)  LIPOPROTEIN A (LPA)  BASIC METABOLIC PANEL  MAGNESIUM  CBC WITH DIFFERENTIAL/PLATELET    EKG None  Radiology DG Neck Soft Tissue  Result Date: 11/20/2023 CLINICAL DATA:  Choking EXAM: NECK SOFT TISSUES - 1+ VIEW COMPARISON:  None Available. FINDINGS: There is no evidence of retropharyngeal soft tissue swelling or epiglottic enlargement. The cervical airway is unremarkable and no radio-opaque foreign body identified. IMPRESSION: Negative. Electronically  Signed   By: Helyn Numbers M.D.   On: 11/20/2023 02:23   DG Chest 2 View  Result Date: 11/20/2023 CLINICAL DATA:  Choking, chest pain EXAM: CHEST - 2 VIEW COMPARISON:  None Available. FINDINGS: Minimal left basilar scarring. 9 mm nodular density noted within the right upper lung zone. Lungs are otherwise clear. No pneumothorax or pleural effusion. Cardiac size within normal limits. Pulmonary vascularity is normal. Osseous structures are age-appropriate. IMPRESSION: 1. No active cardiopulmonary disease. 2. 9 mm nodular density within the right upper lung zone. This could be better assessed with dedicated nonemergent chest CT. Electronically Signed   By: Helyn Numbers M.D.   On: 11/20/2023 02:22    Procedures Procedures    CRITICAL CARE Performed by: Garlon Hatchet   Total critical care time: 60 minutes  Critical care time was exclusive of separately billable procedures and treating other patients.  Critical care was necessary to treat or prevent imminent or life-threatening deterioration.  Critical care was time spent personally by me on the following activities: development of treatment plan with patient and/or surrogate as well as nursing, discussions with consultants, evaluation of patient's response to treatment, examination of patient, obtaining history from patient or surrogate, ordering and performing treatments and interventions, ordering and review of laboratory studies, ordering and review of radiographic studies, pulse oximetry and re-evaluation of patient's condition. '  Medications Ordered in ED Medications  aspirin chewable tablet 324 mg (has no administration in time range)    Or  aspirin suppository 300 mg (has no administration in time range)  aspirin EC tablet 81 mg (has no administration in time range)  nitroGLYCERIN (NITROSTAT) SL tablet 0.4 mg (has no administration in time range)  acetaminophen (TYLENOL) tablet 650 mg (has no administration in time range)   ondansetron (ZOFRAN) injection 4 mg (has no administration in time range)  atorvastatin (LIPITOR) tablet 80 mg (has no administration in time range)  methylPREDNISolone sodium succinate (SOLU-MEDROL) 125 mg/2 mL injection 125 mg (125 mg Intravenous Given 11/20/23 0152)  diphenhydrAMINE (BENADRYL) injection 25 mg (25 mg Intravenous Given 11/20/23 0152)  famotidine (PEPCID) IVPB 20 mg premix (0 mg Intravenous Hold 11/20/23 0246)  aspirin chewable tablet 324 mg (324 mg Oral Given 11/20/23 0249)  potassium chloride SA (KLOR-CON M) CR tablet 40 mEq (40 mEq Oral Given 11/20/23 0250)    ED Course/ Medical Decision Making/ A&P                                 Medical Decision Making Amount and/or Complexity of Data Reviewed Labs: ordered. Radiology: ordered and independent interpretation performed. ECG/medicine tests: ordered and independent interpretation performed.  Risk OTC drugs. Prescription drug management. Decision regarding hospitalization.   57 y.o. F here with choking sensation after eating bread about 1 hour PTA.  Denies FB sensation, rather feels like throat is closing and difficulty breathing, almost like being suffocated.  She has eaten this before without any issue.  She has no known food allergies.  No intervention tried prior to arrival.  Patient does not have any evidence of airway compromise on exam.  She is not wheezing.  Her vitals are stable on room air.  Question allergic component.  She does not have any signs or symptoms suggestive of fluid bolus as she is swallowing easily currently.  Given Solu-Medrol, Benadryl, Pepcid.  Labs and chest x-ray pending.  2:39 AM Patient feeling better after medications here.  Remains without airway compromise.  Troponin is elevated at 99.  No recent chest pain or SOB, no exertional symptoms.  No illicit drug or EtOH use. She has been taking HTN meds as prescribed.  BP is elevated here, always somewhat though.  Symptoms quite atypical,  seem more allergic reaction type in nature and has improved with steroids/benadryl here.  Given full dose ASA, hold on heparin for now.  K+ 2.8, given oral replacement.  Sending Mg+ level.  Will discuss with cardiology.  2:47 AM Spoke with Dr. Regino Schultze with cardiology-- has reviewed EKG, labs from today.  Recommended to cycle enzymes, echo and +/- coronary CT.  Agrees to holding on heparin for now unless trop increasing exponentially.  Cardiology has called back and will formally consult on patient.  Repeat EKG obtained at their request which actually appears improved.  They will provide recommendations.  Discussed with Dr. Joneen Roach-- will admit for ongoing care.  Delta troponin now 713.  Significant increase from prior.  Cards aware-- will start heparin now, hold on coronary CT, likely need cath in the AM.  Will relay to morning team.  Patient relayed to cardiology she had not been taking her home HTN and HLP meds in a year, however when I inquired about this earlier she reported she was taking as directed.  Admitting MD updated as well.  Final Clinical Impression(s) / ED Diagnoses Final diagnoses:  Choking sensation  Elevated troponin    Rx / DC Orders ED Discharge Orders     None         Garlon Hatchet, PA-C 11/20/23 1610    Marily Memos, MD 11/21/23 [phone]

## 2023-11-20 NOTE — ED Triage Notes (Signed)
Pt is coming in for a sensation of choking that has occurred since she ate some bread 1hr ago, she has no known allergies to food or medications. She also states it feels tight in her chest, with some palpitations, She also mentions having some shortness of breath.

## 2023-11-20 NOTE — Consult Note (Addendum)
Cardiology Consultation   Patient ID: Marie Ayala MRN: [id number]; DOB: [birth year]  Admit date: 11/20/2023 Date of Consult: 11/20/2023  PCP:  Department, [facility] Providers Cardiologist:  None        Patient Profile:   Marie Ayala is a 57 y.o. female with a hx of HTN who is being seen 11/20/2023 for the evaluation of positive troponin at the request of the ED.  History of Present Illness:   Ms. Marie Ayala with translation assist from daughter at bedside comes in tonight with a choking sensation after eating bread, symptoms seem somewhat improved after ED treated with steroids, pepcid, and bendaryl.    Currently denies any chest discomfort, none at home either. Possibly short of breath No dizziness Feeling weak overall  While chart notes she was prescribed statin and antihypertensives, confirmed that she has not taken any in the last year (came off because she felt she did not need them).  BP quite elevated in ED to 190s.  Initial troponin 99. Initial ECG showed some mild bowing of the Sts in V2-4, with repeat ECG, her ST segments look fine without elevation/depression. CXR without pulmonary edema    Past Medical History:  Diagnosis Date   Essential hypertension 07/28/2017   Hyperlipidemia 2019   Hypertension 2016   Overweight (BMI 25.0-29.9) 2020    Past Surgical History:  Procedure Laterality Date   CESAREAN SECTION  1991, 2001, 2012       Inpatient Medications: Scheduled Meds:  Continuous Infusions:  PRN Meds:   Allergies:   No Known Allergies  Social History:   Social History   Socioeconomic History   Marital status: Married    Spouse name: Angola Davila   Number of children: 5   Years of education: 9   Highest education level: Not on file  Occupational History   Occupation: unemployed. 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   Lives in [place] with husband and young son   Rest of children are adults and on their own   Social Determinants of Health   Financial Resource Strain: High Risk (08/02/2019)   Overall Financial Resource Strain (CARDIA)    Difficulty of Paying Living Expenses: Hard  Food Insecurity: No Food Insecurity (08/02/2019)   Hunger Vital Sign    Worried About Running Out of Food in the Last Year: Never true    Ran Out of Food in the Last Year: Never true  Transportation Needs: No Transportation Needs (08/02/2019)   PRAPARE - Administrator, Civil Service (Medical): No    Lack of Transportation (Non-Medical): No  Physical Activity: Inactive (08/02/2019)   Exercise Vital Sign    Days of Exercise per Week: 0 days    Minutes of Exercise per Session: 0 min  Stress: Stress Concern Present (08/02/2019)   [employer] of Occupational Health - Occupational Stress  Questionnaire    Feeling of Stress : Rather much  Social Connections: Unknown (08/02/2019)   Social Connection and Isolation Panel [NHANES]    Frequency of Communication with Friends and Family: Not on file    Frequency of Social Gatherings with Friends and Family: Not on file    Attends Religious Services: Not on file    Active Member of Clubs or Organizations: Not on file    Attends Banker Meetings: Not on file    Marital Status: Married  Intimate Partner Violence: Not At Risk (08/02/2019)   Humiliation, Afraid, Rape, and Kick  questionnaire    Fear of Current or Ex-Partner: No    Emotionally Abused: No    Physically Abused: No    Sexually Abused: No    Family History:   As below Family History  Problem Relation Age of Onset   Hypertension Mother    Heart disease Mother        MI   Diabetes Mother      ROS:  Please see the history of present illness.  unremarkable All other ROS reviewed and negative.     Physical Exam/Data:   Vitals:   11/20/23 0147 11/20/23 0247 11/20/23 0248 11/20/23 0300  BP:  (!) 186/100  (!) 175/99  Pulse:  83 73 67  Resp:  17 13 19   Temp:      SpO2:  100% 100% 100%  Weight: 72.6 kg     Height: 5\' 3"  (1.6 m)      No intake or output data in the 24 hours ending 11/20/23 0401    11/20/2023    1:47 AM 10/04/2023    9:50 PM 09/27/2021   10:40 AM  Last 3 Weights  Weight (lbs) 160 lb 155 lb 167 lb  Weight (kg) 72.576 kg 70.308 kg 75.751 kg     Body mass index is 28.34 kg/m.  General:  Well nourished, well developed, in no acute distress HEENT: normal Neck: no JVD Vascular: No carotid bruits; Distal pulses 2+ bilaterally Cardiac:  normal S1, S2; RRR; no murmur  Lungs:  clear to auscultation bilaterally, no wheezing, rhonchi or rales  Abd: soft, nontender, no hepatomegaly  Ext: no edema Musculoskeletal:  No deformities, BUE and BLE strength normal and equal Skin: warm and dry  Neuro:  CNs 2-12 intact, no focal abnormalities noted Psych:  Normal affect   EKG:  The EKG was personally reviewed and demonstrates:  SR, no major STTWA Telemetry:  Telemetry was personally reviewed and demonstrates:  unremarkable  Relevant CV Studies: No prior cardiac studies  Laboratory Data:  High Sensitivity Troponin:   Recent Labs  Lab 11/20/23 0146  TROPONINIHS 99*     Chemistry Recent Labs  Lab 11/20/23 0146 11/20/23 0302  NA 139  --   K 2.8*  --   CL 106  --   CO2 24  --   GLUCOSE 110*  --   BUN 17  --   CREATININE 0.67  --   CALCIUM 9.7  --   MG  --  2.2   GFRNONAA >60  --   ANIONGAP 9  --    Hematology Recent Labs  Lab 11/20/23 0146  WBC 7.1  RBC 4.74  HGB 14.1  HCT 43.7  MCV 92.2  MCH 29.7  MCHC 32.3  RDW 13.0  PLT 246     Radiology/Studies:  DG Neck Soft Tissue  Result Date: 11/20/2023 CLINICAL DATA:  Choking EXAM: NECK SOFT TISSUES -  1+ VIEW COMPARISON:  None Available. FINDINGS: There is no evidence of retropharyngeal soft tissue swelling or epiglottic enlargement. The cervical airway is unremarkable and no radio-opaque foreign body identified. IMPRESSION: Negative. Electronically Signed   By: Helyn Numbers M.D.   On: 11/20/2023 02:23   DG Chest 2 View  Result Date: 11/20/2023 CLINICAL DATA:  Choking, chest pain EXAM: CHEST - 2 VIEW COMPARISON:  None Available. FINDINGS: Minimal left basilar scarring. 9 mm nodular density noted within the right upper lung zone. Lungs are otherwise clear. No pneumothorax or pleural effusion. Cardiac size within normal limits. Pulmonary vascularity is normal. Osseous structures are age-appropriate. IMPRESSION: 1. No active cardiopulmonary disease. 2. 9 mm nodular density within the right upper lung zone. This could be better assessed with dedicated nonemergent chest CT. Electronically Signed   By: Helyn Numbers M.D.   On: 11/20/2023 02:22     Assessment and Plan:   Elevated troponin - clinical picture does not sound like ACS.  Repeat ECG without ischemic changes.  Would cycle markers but no indication currently for heparinization.  Cardiac risk factors present including HTN and possible hyperglycemia. - Recommend echo in the AM - She might benefit from a coronary CTA given risk factors -  Suggest starting ASA - Assess lipids and A1c.  May need a statin.  Elevated Bps - discussed with her daughter that she would definitely need meds for BP control. - target BP <130s for now. - suggest ACEi as she has some hyperglycemia.  Pulse in low 60s make BB less ideal.   Risk Assessment/Risk  Scores:                For questions or updates, please contact [facility] Please consult [email or web address] for contact info under    Signed, Eyvonne Left, MD  11/20/2023 4:01 AM   Addendum: 4:30am Her second troponin did come back much more elevated in the 700s range.  She does seem to be ruling in with a NSTEMI.  Would start heparin IV for ACS. - will also recommend adding statin now. - please cancel coronary CTA, she will likely need a left heart catheterization.  Yu-Ping Regino Schultze

## 2023-11-20 NOTE — H&P (Signed)
PCP:   Department, [facility]   Chief Complaint:  Chest discomfort  HPI: This is a 57 year old female past medical history HTN, HLD.  Patient stopped taking her medications approximately 1 year ago.  Tonight she developed shortness of breath, chest discomfort and neck discomfort.  She thought she was having allergic reaction to food eaten before.  She came to the ER.  In the ER patient's presenting vitals 1 19-1 22/77/18, afebrile.  Potassium 2.8, troponin 99 => 713, EKG no ST segment elevation.  Heparin drip initiated by EDP.  Cardiology on-call consulted.    Review of Systems:  Per HPI  Past Medical History: Past Medical History:  Diagnosis Date   Essential hypertension 07/28/2017   Hyperlipidemia 2019   Hypertension 2016   Overweight (BMI 25.0-29.9) 2020   Past Surgical History:  Procedure Laterality Date   CESAREAN SECTION  1991, 2001, 2012    Medications: Prior to Admission medications   None    Allergies:  No Known Allergies  Social History:  reports that she has quit smoking. Her smoking use included cigarettes. She has never used smokeless tobacco. She reports that she does not drink alcohol and does not use drugs.  Family History: Family History  Problem Relation Age of Onset   Hypertension Mother    Heart disease Mother        MI   Diabetes Mother     Physical Exam: Vitals:   11/20/23 0147 11/20/23 0247 11/20/23 0248 11/20/23 0300  BP:  (!) 186/100  (!) 175/99  Pulse:  83 73 67  Resp:  17 13 19   Temp:      SpO2:  100% 100% 100%  Weight: 72.6 kg     Height: 5\' 3"  (1.6 m)       General: A and O x 3, well developed and nourished, no acute distress Eyes: PERRLA, pink conjunctiva, no scleral icterus ENT: Moist oral mucosa, neck supple, no thyromegaly Lungs: CTA B/L, no wheeze, no crackles, no use of accessory muscles Cardiovascular: RRR, no rbruits, no JVD Abdomen: soft, positive BS, NTND, no organomegaly, not an acute abdomen GU: not  examined Neuro: CN II - XII grossly intact, sensation intact Musculoskeletal: strength 5/5 all extremities, no clubbing, cyanosis or edema Skin: no rash, no subcutaneous crepitation, no decubitus Psych: appropriate patient   Labs on Admission:  Recent Labs    11/20/23 0146 11/20/23 0302  NA 139  --   K 2.8*  --   CL 106  --   CO2 24  --   GLUCOSE 110*  --   BUN 17  --   CREATININE 0.67  --   CALCIUM 9.7  --   MG  --  2.2    Recent Labs    11/20/23 0146  WBC 7.1  HGB 14.1  HCT 43.7  MCV 92.2  PLT 246    Radiological Exams on Admission: DG Neck Soft Tissue  Result Date: 11/20/2023 CLINICAL DATA:  Choking EXAM: NECK SOFT TISSUES - 1+ VIEW COMPARISON:  None Available. FINDINGS: There is no evidence of retropharyngeal soft tissue swelling or epiglottic enlargement. The cervical airway is unremarkable and no radio-opaque foreign body identified. IMPRESSION: Negative. Electronically Signed   By: Helyn Numbers M.D.   On: 11/20/2023 02:23   DG Chest 2 View  Result Date: 11/20/2023 CLINICAL DATA:  Choking, chest pain EXAM: CHEST - 2 VIEW COMPARISON:  None Available. FINDINGS: Minimal left basilar scarring. 9 mm nodular density noted within the  right upper lung zone. Lungs are otherwise clear. No pneumothorax or pleural effusion. Cardiac size within normal limits. Pulmonary vascularity is normal. Osseous structures are age-appropriate. IMPRESSION: 1. No active cardiopulmonary disease. 2. 9 mm nodular density within the right upper lung zone. This could be better assessed with dedicated nonemergent chest CT. Electronically Signed   By: Helyn Numbers M.D.   On: 11/20/2023 02:22    Assessment/Plan Present on Admission:  NSTEMI -Chest pain order set initiated -Heparin drip, aspirin 325 mg p.o. daily -Lipid panel, atorvastatin 80 mg p.o. q. of sleep, first dose now -Coreg 6.25 mg p.o. daily, lisinopril 10 mg p.o. daily -As needed blood pressure medication SBP>160 -2D echo in  a.m. -Cardiology on board.  Likely LHC in a.m. -N.p.o., IV fluid hydration -Nitro as needed.  Per daughter/patient currently chest pain-free   Hypokalemia -Potassium repleted.  Repeat BMP ordered -Magnesium level normal    Essential hypertension -Coreg, lisinopril ordered   Hyperlipidemia  -atorvastatin ordered.  Lipid panel ordered   Porche Steinberger 11/20/2023, 3:48 AM

## 2023-11-20 NOTE — Progress Notes (Signed)
   Patient Name: Marie Ayala Date of Encounter: 11/20/2023 [facility] Cardiologist: None   Interval Summary  .    Still complaining of a feeling of tightness in her throat.  Denies chest pain.  Vital Signs .    Vitals:   11/20/23 0600 11/20/23 0630 11/20/23 0722 11/20/23 0724  BP: 118/71 104/64 122/80   Pulse: 71 72 78   Resp: 19 19 16    Temp:   97.7 F (36.5 C)   TempSrc:   Oral   SpO2: 100% 99% 100% 100%  Weight:      Height:       No intake or output data in the 24 hours ending 11/20/23 0821    11/20/2023    1:47 AM 10/04/2023    9:50 PM 09/27/2021   10:40 AM  Last 3 Weights  Weight (lbs) 160 lb 155 lb 167 lb  Weight (kg) 72.576 kg 70.308 kg 75.751 kg      Telemetry/ECG    Sinus rhythm with no arrhythmia- Personally Reviewed  Physical Exam .   GEN: No acute distress.   Neck: No JVD Cardiac: RRR, no murmurs, rubs, or gallops.  Respiratory: Clear to auscultation bilaterally. GI: Soft, nontender, non-distended  MS: No edema  Assessment & Plan .     Elevated troponin, occurred in setting of a 'choking sensation' after eating. Treated initially with steroids, pepcid, and benadryl. However, she then had uptrending HS-trop 99---->713. Now on IV heparin for treatment of ACS/NSTEMI.  HTN, untreated as outpatient.  Now on lisinopril and metoprolol. Mixed hyperlipidemia: LDL 98.  On rosuvastatin 10 mg daily.  Intensify of significant CAD.  Difficult situation and I am unclear if her symptoms could represent ACS or whether they are noncardiac related to some esophageal pathology.  She does have significant underlying risk factors and with her high-sensitivity troponin increasing from 99-713, I think definitive evaluation with cardiac catheterization is indicated. I have reviewed the risks, indications, and alternatives to cardiac catheterization, possible angioplasty, and stenting with the patient. Risks include but are not limited to bleeding,  infection, vascular injury, stroke, myocardial infection, arrhythmia, kidney injury, radiation-related injury in the case of prolonged fluoroscopy use, emergency cardiac surgery, and death. The patient understands the risks of serious complication is 1-2 in 1000 with diagnostic cardiac cath and 1-2% or less with angioplasty/stenting.  Patient and daughter understand and they are agreeable to proceed.  Continue IV heparin.  Follow-up after catheterization.  For questions or updates, please contact [facility] Please consult [email or web address] for contact info under        Signed, Tonny Bollman, MD

## 2023-11-20 NOTE — Progress Notes (Signed)
PHARMACY - ANTICOAGULATION CONSULT NOTE  Pharmacy Consult for Heparin  Indication: chest pain/ACS  No Known Allergies  Patient Measurements: Height: 5\' 3"  (160 cm) Weight: 72.6 kg (160 lb) IBW/kg (Calculated) : 52.4  Vital Signs: Temp: 97.4 F (36.3 C) (11/29 0138) BP: 138/85 (11/29 0432) Pulse Rate: 66 (11/29 0433)  Labs: Recent Labs    11/20/23 0146 11/20/23 0330  HGB 14.1  --   HCT 43.7  --   PLT 246  --   CREATININE 0.67  --   TROPONINIHS 99* 713*    Estimated Creatinine Clearance: 75.9 mL/min (by C-G formula based on SCr of 0.67 mg/dL).   Medical History: Past Medical History:  Diagnosis Date   Essential hypertension 07/28/2017   Hyperlipidemia 2019   Hypertension 2016   Overweight (BMI 25.0-29.9) 2020    Assessment: 57 y/o F found to have elevated troponin (99>>713). Starting heparin. Above labs reviewed. No anti-coagulants PTA. .   Goal of Therapy:  Heparin level 0.3-0.7 units/ml Monitor platelets by anticoagulation protocol: Yes   Plan:  Heparin 4000 units BOLUS Start heparin drip at 900 units/hr Heparin level in 6-8 hours Daily CBC/Heparin level Monitor for bleeding  Abran Duke, PharmD, BCPS Clinical Pharmacist Phone: [phone]

## 2023-11-20 NOTE — Plan of Care (Signed)
  Problem: Education: Goal: Understanding of cardiac disease, CV risk reduction, and recovery process will improve Outcome: Progressing Goal: Individualized Educational Video(s) Outcome: Progressing   Problem: Activity: Goal: Ability to tolerate increased activity will improve Outcome: Progressing   Problem: Cardiac: Goal: Ability to achieve and maintain adequate cardiovascular perfusion will improve Outcome: Progressing   Problem: Health Behavior/Discharge Planning: Goal: Ability to safely manage health-related needs after discharge will improve Outcome: Progressing   Problem: Education: Goal: Knowledge of General Education information will improve Description: Including pain rating scale, medication(s)/side effects and non-pharmacologic comfort measures Outcome: Progressing   Problem: Health Behavior/Discharge Planning: Goal: Ability to manage health-related needs will improve Outcome: Progressing   Problem: Clinical Measurements: Goal: Ability to maintain clinical measurements within normal limits will improve Outcome: Progressing Goal: Will remain free from infection Outcome: Progressing Goal: Diagnostic test results will improve Outcome: Progressing Goal: Respiratory complications will improve Outcome: Progressing Goal: Cardiovascular complication will be avoided Outcome: Progressing   Problem: Nutrition: Goal: Adequate nutrition will be maintained Outcome: Progressing   Problem: Coping: Goal: Level of anxiety will decrease Outcome: Progressing   Problem: Elimination: Goal: Will not experience complications related to bowel motility Outcome: Progressing Goal: Will not experience complications related to urinary retention Outcome: Progressing   Problem: Pain Management: Goal: General experience of comfort will improve Outcome: Progressing   Problem: Safety: Goal: Ability to remain free from injury will improve Outcome: Progressing   Problem: Skin  Integrity: Goal: Risk for impaired skin integrity will decrease Outcome: Progressing

## 2023-11-21 DIAGNOSIS — I214 Non-ST elevation (NSTEMI) myocardial infarction: Secondary | ICD-10-CM | POA: Diagnosis not present

## 2023-11-21 DIAGNOSIS — R7989 Other specified abnormal findings of blood chemistry: Secondary | ICD-10-CM | POA: Diagnosis not present

## 2023-11-21 DIAGNOSIS — I10 Essential (primary) hypertension: Secondary | ICD-10-CM | POA: Diagnosis not present

## 2023-11-21 LAB — LIPOPROTEIN A (LPA): Lipoprotein (a): 229.1 nmol/L — ABNORMAL HIGH (ref <75.0)

## 2023-11-21 MED ORDER — ATORVASTATIN CALCIUM 80 MG PO TABS: 80.0000 mg | ORAL_TABLET | Freq: Every evening | ORAL | 1 refills | Status: DC

## 2023-11-21 MED ORDER — METOPROLOL TARTRATE 25 MG PO TABS: 12.5000 mg | ORAL_TABLET | Freq: Two times a day (BID) | ORAL | 1 refills | Status: DC

## 2023-11-21 MED ORDER — LISINOPRIL 5 MG PO TABS: 5.0000 mg | ORAL_TABLET | Freq: Every day | ORAL | 1 refills | Status: DC

## 2023-11-21 MED ORDER — ASPIRIN 81 MG PO TBEC: 81.0000 mg | DELAYED_RELEASE_TABLET | Freq: Every day | ORAL | 12 refills | Status: AC

## 2023-11-21 MED ORDER — TICAGRELOR 90 MG PO TABS: 90.0000 mg | ORAL_TABLET | Freq: Two times a day (BID) | ORAL | 3 refills | Status: DC

## 2023-11-21 NOTE — Progress Notes (Signed)
Progress Note  Patient Name: Marie Ayala Date of Encounter: 11/21/2023  Primary Cardiologist:   None   Subjective   No chest pain.  No SOB.   Inpatient Medications    Scheduled Meds:  aspirin EC  81 mg Oral Daily   atorvastatin  80 mg Oral QPM   lisinopril  5 mg Oral Daily   metoprolol tartrate  12.5 mg Oral BID   ticagrelor  90 mg Oral BID   Continuous Infusions:  PRN Meds: acetaminophen, metoprolol tartrate, nitroGLYCERIN, ondansetron (ZOFRAN) IV   Vital Signs    Vitals:   11/21/23 0000 11/21/23 0100 11/21/23 0300 11/21/23 0953  BP: 103/62 97/60 100/66 133/76  Pulse: 68 66 68 73  Resp: 17 16 18 18   Temp: 97.7 F (36.5 C)     TempSrc: Oral     SpO2: 96% 98% 97% 97%  Weight:      Height:        Intake/Output Summary (Last 24 hours) at 11/21/2023 1135 Last data filed at 11/20/2023 2000 Gross per 24 hour  Intake 598.09 ml  Output --  Net 598.09 ml   Filed Weights   11/20/23 0147  Weight: 72.6 kg    Telemetry    NSR - Personally Reviewed  ECG    NA - Personally Reviewed  Physical Exam   GEN: No acute distress.   Neck: No  JVD Cardiac: RRR, no murmurs, rubs, or gallops.  Respiratory: Clear  to auscultation bilaterally. GI: Soft, nontender, non-distended  MS: No  edema; No deformity.  Mild swelling and very slight echymosis at the right radial site.  No oozing or hematoma Neuro:  Nonfocal  Psych: Normal affect   Labs    Chemistry Recent Labs  Lab 11/20/23 0146 11/20/23 0532  NA 139 139  K 2.8* 3.5  CL 106 107  CO2 24 23  GLUCOSE 110* 127*  BUN 17 18  CREATININE 0.67 0.58  CALCIUM 9.7 9.5  GFRNONAA >60 >60  ANIONGAP 9 9     Hematology Recent Labs  Lab 11/20/23 0146 11/20/23 0532  WBC 7.1 9.8  RBC 4.74 4.56  HGB 14.1 13.5  HCT 43.7 41.6  MCV 92.2 91.2  MCH 29.7 29.6  MCHC 32.3 32.5  RDW 13.0 13.1  PLT 246 232    Cardiac EnzymesNo results for input(s): "TROPONINI" in the last 168 hours. No results  for input(s): "TROPIPOC" in the last 168 hours.   BNPNo results for input(s): "BNP", "PROBNP" in the last 168 hours.   DDimer No results for input(s): "DDIMER" in the last 168 hours.   Radiology    CARDIAC CATHETERIZATION  Result Date: 11/20/2023 Images from the original result were not included. Left Heart Catheterization 11/20/23: Hemodynamic data: LV 108/21, EDP 15 mmHg.  Ao 107/58, mean 82 mmHg.  No pressure gradient across the aortic valve. Angiographic data: LV: Normal LV systolic function.  No wall motion abnormality. LM: Large-caliber vessel.  It is smooth and normal. LAD: Large-caliber vessel.  Proximal LAD starting from the near ostium, has a focal 95 to 99% stenosis with TIMI II flow in the LAD.  Large D1 with a proximal 80% stenosis.  The LAD after D1 has mild diffuse disease and at the origin of D2 there is a 50% stenosis and the ostium of the D2 has a 90% stenosis. LCx: Moderate caliber vessel.  Gives origin to very small OM 1 and 2 with ostial 90% stenosis, proximal CX/20% stenosis.  Proximal to  large OM 3 there is a focal 70 to 80% stenosis in the CX. RCA: Dominant, mild disease in the midsegment.  Gives origin to large PL and PDA branch. Intervention data: Successful PTCA and stenting of the ostial and proximal LAD with 3.5 x 18 mm Onyx frontier DES postdilated with 3 oh by 8 mm [place] balloon at 20 atmospheric pressure, stenosis reduced from 99% to 0% and TIMI flow 2-3 postprocedure. 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  She will need DAPT for 1 year.   ECHOCARDIOGRAM COMPLETE  Result Date: 11/20/2023    ECHOCARDIOGRAM REPORT   Patient Name:   [name] [facility] Date of Exam: 11/20/2023 Medical Rec #:  [id number]                  Height:       63.0 in Accession #:    [id number]                 Weight:       160.0 lb Date of Birth:  30-Jan-1966                  BSA:          1.759 m Patient Age:    56 years                    BP:           122/80 mmHg Patient Gender: F                          HR:           72 bpm. Exam Location:  Inpatient Procedure: 2D Echo, Color Doppler and Cardiac Doppler Indications:    elevated troponin  History:        Patient has no prior history of Echocardiogram examinations.                 Risk Factors:Hypertension, Dyslipidemia and Former Smoker.  Sonographer:    Delcie Roch RDCS Referring Phys: [phone] DEBBY CROSLEY IMPRESSIONS  1. Left ventricular ejection fraction, by estimation, is 55 to 60%. The left ventricle has normal function. The left ventricle has no regional wall motion abnormalities. Left ventricular diastolic parameters are consistent with Grade I diastolic dysfunction (impaired relaxation).  2. Right ventricular systolic function is normal. The right ventricular size is normal. Tricuspid regurgitation signal is inadequate for assessing PA pressure.  3. Left atrial size was mildly dilated.  4. The mitral valve is normal in structure. No evidence of mitral valve regurgitation. No evidence of mitral stenosis.  5. The aortic valve is tricuspid. Aortic valve regurgitation is not visualized. No aortic stenosis is present.  6. The inferior vena cava is normal in size with greater than 50% respiratory variability, suggesting right atrial pressure of 3 mmHg. FINDINGS  Left Ventricle: Left ventricular ejection fraction, by estimation, is 55 to 60%. The left ventricle has normal function. The left ventricle has no regional wall motion abnormalities. The left ventricular internal cavity size was normal in size. There is  no left ventricular hypertrophy. Left ventricular diastolic parameters are consistent with Grade I diastolic dysfunction (impaired relaxation). Right Ventricle: The right ventricular size is normal. No increase in right ventricular wall thickness. Right ventricular systolic function is normal. Tricuspid regurgitation signal is inadequate for assessing PA pressure.  Left Atrium: Left atrial size was mildly dilated. Right Atrium:  Right atrial size was normal in size. Pericardium: There is no evidence of pericardial effusion. Mitral Valve: The mitral valve is normal in structure. No evidence of mitral valve regurgitation. No evidence of mitral valve stenosis. Tricuspid Valve: The tricuspid valve is normal in structure. Tricuspid valve regurgitation is not demonstrated. Aortic Valve: The aortic valve is tricuspid. Aortic valve regurgitation is not visualized. No aortic stenosis is present. Pulmonic Valve: The pulmonic valve was normal in structure. Pulmonic valve regurgitation is trivial. Aorta: The aortic root is normal in size and structure. Venous: The inferior vena cava is normal in size with greater than 50% respiratory variability, suggesting right atrial pressure of 3 mmHg. IAS/Shunts: No atrial level shunt detected by color flow Doppler.  LEFT VENTRICLE PLAX 2D LVIDd:         3.80 cm   Diastology LVIDs:         2.20 cm   LV e' medial:    6.09 cm/s LV PW:         1.00 cm   LV E/e' medial:  9.1 LV IVS:        1.10 cm   LV e' lateral:   8.92 cm/s LVOT diam:     1.80 cm   LV E/e' lateral: 6.2 LV SV:         58 LV SV Index:   33 LVOT Area:     2.54 cm  RIGHT VENTRICLE             IVC RV Basal diam:  2.70 cm     IVC diam: 1.30 cm RV S prime:     10.70 cm/s TAPSE (M-mode): 2.3 cm LEFT ATRIUM             Index        RIGHT ATRIUM           Index LA diam:        3.40 cm 1.93 cm/m   RA Area:     11.90 cm LA Vol (A2C):   47.5 ml 27.01 ml/m  RA Volume:   26.20 ml  14.90 ml/m LA Vol (A4C):   52.8 ml 30.02 ml/m LA Biplane Vol: 53.2 ml 30.25 ml/m  AORTIC VALVE LVOT Vmax:   101.00 cm/s LVOT Vmean:  70.400 cm/s LVOT VTI:    0.229 m  AORTA Ao Root diam: 3.10 cm Ao Asc diam:  3.10 cm MITRAL VALVE MV Area (PHT): 5.13 cm    SHUNTS MV Decel Time: 148 msec    Systemic VTI:  0.23 m MV E velocity: 55.50 cm/s  Systemic Diam: 1.80 cm MV A velocity: 54.80 cm/s MV E/A ratio:  1.01 Dalton  McleanMD Electronically signed by Wilfred Lacy Signature Date/Time: 11/20/2023/9:09:32 AM    Final    DG Neck Soft Tissue  Result Date: 11/20/2023 CLINICAL DATA:  Choking EXAM: NECK SOFT TISSUES - 1+ VIEW COMPARISON:  None Available. FINDINGS: There is no evidence of retropharyngeal soft tissue swelling or epiglottic enlargement. The cervical airway is unremarkable and no radio-opaque foreign body identified. IMPRESSION: Negative. Electronically Signed   By: Helyn Numbers M.D.   On: 11/20/2023 02:23   DG Chest 2 View  Result Date: 11/20/2023 CLINICAL DATA:  Choking, chest pain EXAM: CHEST - 2 VIEW COMPARISON:  None Available. FINDINGS: Minimal left basilar scarring. 9 mm nodular density noted within the right upper lung zone. Lungs are otherwise clear. No pneumothorax or pleural effusion. Cardiac size within normal limits. Pulmonary vascularity is normal. Osseous structures  are age-appropriate. IMPRESSION: 1. No active cardiopulmonary disease. 2. 9 mm nodular density within the right upper lung zone. This could be better assessed with dedicated nonemergent chest CT. Electronically Signed   By: Helyn Numbers M.D.   On: 11/20/2023 02:22    Cardiac Studies       Patient Profile     57 y.o. female  57 y.o. female with a hx of HTN who is being seen 11/20/2023 for the evaluation of positive troponin at the request of the ED.   Assessment & Plan    CAD/PCI:  As above.  DAPT x 1 year.    HTN:   Continue meds as listed.      Dyslipidemia:   Statin dose increased this admission.  Repeat lipid in 3 months.      For questions or updates, please contact [facility] Please consult [email or web address] for contact info under Cardiology/STEMI.   Signed, Rollene Rotunda, MD  11/21/2023, 11:35 AM  '

## 2023-11-21 NOTE — TOC Transition Note (Signed)
Transition of Care [facility]) - CM/SW Discharge Note   Patient Details  Name: Marie Ayala MRN: [id number] Date of Birth: December 08, 1966  [facility] CM/SW Contact:  Ronny Bacon, RN Phone Number: 11/21/2023, 1:29 PM   Clinical Narrative:   Patient is being discharged today. Secure message from nurse to discuss patient need for PCP. Spoke with patient, discussed medicaid provider usually assigned by medicaid case worker and should be listed on medicaid card. If it is a provider she does not want, she can reach out to medicaid case worker to assign someone different. Patient verbalized understanding.    Final next level of care: Home/Self Care Barriers to Discharge: No Barriers Identified   Patient Goals and CMS Choice      Discharge Placement                         Discharge Plan and Services Additional resources added to the After Visit Summary for                                       Social Determinants of Health (SDOH) Interventions SDOH Screenings   Food Insecurity: No Food Insecurity (11/20/2023)  Housing: Low Risk  (11/20/2023)  Transportation Needs: No Transportation Needs (11/20/2023)  Utilities: Not At Risk (11/20/2023)  Financial Resource Strain: High Risk (08/02/2019)  Physical Activity: Inactive (08/02/2019)  Social Connections: Unknown (08/02/2019)  Stress: Stress Concern Present (08/02/2019)  Tobacco Use: Medium Risk (11/20/2023)     Readmission Risk Interventions     No data to display

## 2023-11-21 NOTE — Discharge Summary (Signed)
Physician Discharge Summary  Marie Ayala WUJ:[id number] DOB: [birth year] DOA: 11/20/2023  PCP: Department, [facility]  Admit date: 11/20/2023 Discharge date: 11/21/2023  Admitted From: Home Disposition:  Home  Recommendations for Outpatient Follow-up:  Follow up with PCP in 1-2 weeks Please obtain BMP/CBC in one week   Home Health:No Equipment/Devices:none  Discharge Condition:Stable CODE STATUS:Full Diet recommendation: Heart Healthy  Brief/Interim Summary: 57 y.o. female past medical history significant for essential hypertension hyperlipidemia stopped taking his medication about a year ago developed shortness of breath and chest pain along with neck discomfort.  Potassium 2.8 twelve-lead EKG showed no ST segment elevation, troponins of doubled. Started on IV heparin drip cardiology was consulted.  Discharge Diagnoses:  Principal Problem:   Elevated troponin Active Problems:   Essential hypertension   Hyperlipidemia   Tobacco abuse   Troponin level elevated   NSTEMI (non-ST elevated myocardial infarction) (HCC)  Elevated troponins/NSTEMI: Cardiac enzymes were cycled and she basically doubled her troponin. 2D echo was done showed an EF of 55% no regional wall motion abnormality grade 1 diastolic dysfunction. Left heart cath was done and she status PCI to the proximal LAD, cardiology recommended aspirin Brilinta for at least a year. She continue lisinopril and metoprolol as an outpatient.  Essential hypertension: Blood pressure is well-controlled continue metoprolol and lisinopril as an outpatient.  Hyperlipidemia: Continue statins.  Tobacco abuse: Counseling.  Hyperglycemia: Likely reactive and in the setting of steroids they have improved.   Discharge Instructions  Discharge Instructions     AMB referral to Phase II Cardiac Rehabilitation   Complete by: As directed    Diagnosis:  Coronary Stents NSTEMI     After initial  evaluation and assessments completed: Virtual Based Care may be provided alone or in conjunction with Phase 2 Cardiac Rehab based on patient barriers.: Yes   Intensive Cardiac Rehabilitation (ICR) [facility] location only OR Traditional Cardiac Rehabilitation (TCR) *If criteria for ICR are not met will enroll in TCR [facility] only): Yes   Diet - low sodium heart healthy   Complete by: As directed    Increase activity slowly   Complete by: As directed       Allergies as of 11/21/2023   No Known Allergies      Medication List     TAKE these medications    aspirin EC 81 MG tablet Take 1 tablet (81 mg total) by mouth daily. Swallow whole. Start taking on: November 22, 2023   atorvastatin 80 MG tablet Commonly known as: LIPITOR Take 1 tablet (80 mg total) by mouth every evening.   lisinopril 5 MG tablet Commonly known as: ZESTRIL Take 1 tablet (5 mg total) by mouth daily. Start taking on: November 22, 2023   metoprolol tartrate 25 MG tablet Commonly known as: LOPRESSOR Take 0.5 tablets (12.5 mg total) by mouth 2 (two) times daily.   ticagrelor 90 MG Tabs tablet Commonly known as: BRILINTA Take 1 tablet (90 mg total) by mouth 2 (two) times daily.        No Known Allergies  Consultations: Cardiology   Procedures/Studies: CARDIAC CATHETERIZATION  Result Date: 11/20/2023 Images from the original result were not included. Left Heart Catheterization 11/20/23: Hemodynamic data: LV 108/21, EDP 15 mmHg.  Ao 107/58, mean 82 mmHg.  No pressure gradient across the aortic valve. Angiographic data: LV: Normal LV systolic function.  No wall motion abnormality. LM: Large-caliber vessel.  It is smooth and normal. LAD: Large-caliber vessel.  Proximal LAD starting from the near ostium,  has a focal 95 to 99% stenosis with TIMI II flow in the LAD.  Large D1 with a proximal 80% stenosis.  The LAD after D1 has mild diffuse disease and at the origin of D2 there is a 50% stenosis and the ostium of the D2  has a 90% stenosis. LCx: Moderate caliber vessel.  Gives origin to very small OM 1 and 2 with ostial 90% stenosis, proximal CX/20% stenosis.  Proximal to large OM 3 there is a focal 70 to 80% stenosis in the CX. RCA: Dominant, mild disease in the midsegment.  Gives origin to large PL and PDA branch. Intervention data: Successful PTCA and stenting of the ostial and proximal LAD with 3.5 x 18 mm Onyx frontier DES postdilated with 3 oh by 8 mm Sidney balloon at 20 atmospheric pressure, stenosis reduced from 99% to 0% and TIMI flow 2-3 postprocedure. 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  She will need DAPT for 1 year.   ECHOCARDIOGRAM COMPLETE  Result Date: 11/20/2023    ECHOCARDIOGRAM REPORT   Patient Name:   Marie Ayala [facility] Date of Exam: 11/20/2023 Medical Rec #:  [id number]                  Height:       63.0 in Accession #:    [id number]                 Weight:       160.0 lb Date of Birth:  02/03/1966                  BSA:          1.759 m Patient Age:    56 years                   BP:           122/80 mmHg Patient Gender: F                          HR:           72 bpm. Exam Location:  Inpatient Procedure: 2D Echo, Color Doppler and Cardiac Doppler Indications:    elevated troponin  History:        Patient has no prior history of Echocardiogram examinations.                 Risk Factors:Hypertension, Dyslipidemia and Former Smoker.  Sonographer:    Delcie Roch RDCS Referring Phys: [phone] DEBBY CROSLEY IMPRESSIONS  1. Left ventricular ejection fraction, by estimation, is 55 to 60%. The left ventricle has normal function. The left ventricle has no regional wall motion abnormalities. Left ventricular diastolic parameters are consistent with Grade I diastolic dysfunction (impaired relaxation).  2. Right ventricular systolic function is normal. The right ventricular size is normal. Tricuspid regurgitation signal is inadequate for  assessing PA pressure.  3. Left atrial size was mildly dilated.  4. The mitral valve is normal in structure. No evidence of mitral valve regurgitation. No evidence of mitral stenosis.  5. The aortic valve is tricuspid. Aortic valve regurgitation is not visualized. No aortic stenosis is present.  6. The inferior vena cava is normal in size with greater than 50% respiratory variability, suggesting right atrial pressure of 3 mmHg. FINDINGS  Left Ventricle: Left ventricular ejection fraction, by estimation, is 55 to 60%. The left ventricle has normal function. The left ventricle  has no regional wall motion abnormalities. The left ventricular internal cavity size was normal in size. There is  no left ventricular hypertrophy. Left ventricular diastolic parameters are consistent with Grade I diastolic dysfunction (impaired relaxation). Right Ventricle: The right ventricular size is normal. No increase in right ventricular wall thickness. Right ventricular systolic function is normal. Tricuspid regurgitation signal is inadequate for assessing PA pressure. Left Atrium: Left atrial size was mildly dilated. Right Atrium: Right atrial size was normal in size. Pericardium: There is no evidence of pericardial effusion. Mitral Valve: The mitral valve is normal in structure. No evidence of mitral valve regurgitation. No evidence of mitral valve stenosis. Tricuspid Valve: The tricuspid valve is normal in structure. Tricuspid valve regurgitation is not demonstrated. Aortic Valve: The aortic valve is tricuspid. Aortic valve regurgitation is not visualized. No aortic stenosis is present. Pulmonic Valve: The pulmonic valve was normal in structure. Pulmonic valve regurgitation is trivial. Aorta: The aortic root is normal in size and structure. Venous: The inferior vena cava is normal in size with greater than 50% respiratory variability, suggesting right atrial pressure of 3 mmHg. IAS/Shunts: No atrial level shunt detected by color flow  Doppler.  LEFT VENTRICLE PLAX 2D LVIDd:         3.80 cm   Diastology LVIDs:         2.20 cm   LV e' medial:    6.09 cm/s LV PW:         1.00 cm   LV E/e' medial:  9.1 LV IVS:        1.10 cm   LV e' lateral:   8.92 cm/s LVOT diam:     1.80 cm   LV E/e' lateral: 6.2 LV SV:         58 LV SV Index:   33 LVOT Area:     2.54 cm  RIGHT VENTRICLE             IVC RV Basal diam:  2.70 cm     IVC diam: 1.30 cm RV S prime:     10.70 cm/s TAPSE (M-mode): 2.3 cm LEFT ATRIUM             Index        RIGHT ATRIUM           Index LA diam:        3.40 cm 1.93 cm/m   RA Area:     11.90 cm LA Vol (A2C):   47.5 ml 27.01 ml/m  RA Volume:   26.20 ml  14.90 ml/m LA Vol (A4C):   52.8 ml 30.02 ml/m LA Biplane Vol: 53.2 ml 30.25 ml/m  AORTIC VALVE LVOT Vmax:   101.00 cm/s LVOT Vmean:  70.400 cm/s LVOT VTI:    0.229 m  AORTA Ao Root diam: 3.10 cm Ao Asc diam:  3.10 cm MITRAL VALVE MV Area (PHT): 5.13 cm    SHUNTS MV Decel Time: 148 msec    Systemic VTI:  0.23 m MV E velocity: 55.50 cm/s  Systemic Diam: 1.80 cm MV A velocity: 54.80 cm/s MV E/A ratio:  1.01 Dalton McleanMD Electronically signed by Wilfred Lacy Signature Date/Time: 11/20/2023/9:09:32 AM    Final    DG Neck Soft Tissue  Result Date: 11/20/2023 CLINICAL DATA:  Choking EXAM: NECK SOFT TISSUES - 1+ VIEW COMPARISON:  None Available. FINDINGS: There is no evidence of retropharyngeal soft tissue swelling or epiglottic enlargement. The cervical airway is unremarkable and no radio-opaque foreign body identified. IMPRESSION:  Negative. Electronically Signed   By: Helyn Numbers M.D.   On: 11/20/2023 02:23   DG Chest 2 View  Result Date: 11/20/2023 CLINICAL DATA:  Choking, chest pain EXAM: CHEST - 2 VIEW COMPARISON:  None Available. FINDINGS: Minimal left basilar scarring. 9 mm nodular density noted within the right upper lung zone. Lungs are otherwise clear. No pneumothorax or pleural effusion. Cardiac size within normal limits. Pulmonary vascularity is normal. Osseous  structures are age-appropriate. IMPRESSION: 1. No active cardiopulmonary disease. 2. 9 mm nodular density within the right upper lung zone. This could be better assessed with dedicated nonemergent chest CT. Electronically Signed   By: Helyn Numbers M.D.   On: 11/20/2023 02:22   (Echo, Carotid, EGD, Colonoscopy, ERCP)    Subjective: No complaints chest pain is resolved.  Discharge Exam: Vitals:   11/21/23 0300 11/21/23 0953  BP: 100/66 133/76  Pulse: 68 73  Resp: 18 18  Temp:    SpO2: 97% 97%   Vitals:   11/21/23 0000 11/21/23 0100 11/21/23 0300 11/21/23 0953  BP: 103/62 97/60 100/66 133/76  Pulse: 68 66 68 73  Resp: 17 16 18 18   Temp: 97.7 F (36.5 C)     TempSrc: Oral     SpO2: 96% 98% 97% 97%  Weight:      Height:        General: Pt is alert, awake, not in acute distress Cardiovascular: RRR, S1/S2 +, no rubs, no gallops Respiratory: CTA bilaterally, no wheezing, no rhonchi Abdominal: Soft, NT, ND, bowel sounds + Extremities: no edema, no cyanosis    The results of significant diagnostics from this hospitalization (including imaging, microbiology, ancillary and laboratory) are listed below for reference.     Microbiology: No results found for this or any previous visit (from the past 240 hour(s)).   Labs: BNP (last 3 results) No results for input(s): "BNP" in the last 8760 hours. Basic Metabolic Panel: Recent Labs  Lab 11/20/23 0146 11/20/23 0302 11/20/23 0532  NA 139  --  139  K 2.8*  --  3.5  CL 106  --  107  CO2 24  --  23  GLUCOSE 110*  --  127*  BUN 17  --  18  CREATININE 0.67  --  0.58  CALCIUM 9.7  --  9.5  MG  --  2.2 2.2   Liver Function Tests: No results for input(s): "AST", "ALT", "ALKPHOS", "BILITOT", "PROT", "ALBUMIN" in the last 168 hours. No results for input(s): "LIPASE", "AMYLASE" in the last 168 hours. No results for input(s): "AMMONIA" in the last 168 hours. CBC: Recent Labs  Lab 11/20/23 0146 11/20/23 0532  WBC 7.1 9.8   NEUTROABS  --  8.4*  HGB 14.1 13.5  HCT 43.7 41.6  MCV 92.2 91.2  PLT 246 232   Cardiac Enzymes: No results for input(s): "CKTOTAL", "CKMB", "CKMBINDEX", "TROPONINI" in the last 168 hours. BNP: Invalid input(s): "POCBNP" CBG: Recent Labs  Lab 11/20/23 0900 11/20/23 1424  GLUCAP 156* 145*   D-Dimer No results for input(s): "DDIMER" in the last 72 hours. Hgb A1c No results for input(s): "HGBA1C" in the last 72 hours. Lipid Profile No results for input(s): "CHOL", "HDL", "LDLCALC", "TRIG", "CHOLHDL", "LDLDIRECT" in the last 72 hours. Thyroid function studies No results for input(s): "TSH", "T4TOTAL", "T3FREE", "THYROIDAB" in the last 72 hours.  Invalid input(s): "FREET3" Anemia work up No results for input(s): "VITAMINB12", "FOLATE", "FERRITIN", "TIBC", "IRON", "RETICCTPCT" in the last 72 hours. Urinalysis    Component  Value Date/Time   COLORURINE STRAW (A) 02/06/2021 1953   APPEARANCEUR CLEAR 02/06/2021 1953   LABSPEC 1.012 02/06/2021 1953   PHURINE 7.0 02/06/2021 1953   GLUCOSEU NEGATIVE 02/06/2021 1953   HGBUR SMALL (A) 02/06/2021 1953   BILIRUBINUR NEGATIVE 02/06/2021 1953   KETONESUR NEGATIVE 02/06/2021 1953   PROTEINUR NEGATIVE 02/06/2021 1953   NITRITE NEGATIVE 02/06/2021 1953   LEUKOCYTESUR NEGATIVE 02/06/2021 1953   Sepsis Labs Recent Labs  Lab 11/20/23 0146 11/20/23 0532  WBC 7.1 9.8   Microbiology No results found for this or any previous visit (from the past 240 hour(s)).   Time coordinating discharge: Over 35 minutes  SIGNED:   Marinda Elk, MD  [facility] 11/21/2023, 12:16 PM Pager   If 7PM-7AM, please contact night-coverage [email or web address] Password [email or web address]

## 2023-11-23 ENCOUNTER — Telehealth (HOSPITAL_COMMUNITY): Payer: Self-pay | Admitting: *Deleted

## 2023-11-23 ENCOUNTER — Encounter (HOSPITAL_COMMUNITY): Payer: Self-pay | Admitting: Cardiology

## 2023-11-23 NOTE — Telephone Encounter (Signed)
CARDIAC REHAB PHASE I     Post MI/stent education including restrictions, risk factors, exercise guidelines, antiplatelet therapy importance, MI booklet, NTG use, heart healthy diet and CRP2 reviewed with pt and daughter. All questions and concerns addressed. Will refer to [facility] for CRP2. Post MI/stent educational materials will be sent via mail today.     Woodroe Chen, RN BSN 11/23/2023 10:57 AM

## 2023-11-24 ENCOUNTER — Telehealth (HOSPITAL_COMMUNITY): Payer: Self-pay

## 2023-11-24 NOTE — Telephone Encounter (Signed)
Called and spoke with pt and pt daughter Joaquin Music, they stated pt is not interested at this time due to pt work schedule.   Closed referral

## 2023-12-01 ENCOUNTER — Encounter: Payer: Self-pay | Admitting: Physician Assistant

## 2023-12-01 ENCOUNTER — Ambulatory Visit: Payer: Medicaid Other | Attending: Physician Assistant | Admitting: Physician Assistant

## 2023-12-01 ENCOUNTER — Ambulatory Visit: Payer: No Typology Code available for payment source | Admitting: Nurse Practitioner

## 2023-12-01 VITALS — BP 136/98 | HR 85 | Ht 63.0 in | Wt 163.0 lb

## 2023-12-01 DIAGNOSIS — I10 Essential (primary) hypertension: Secondary | ICD-10-CM

## 2023-12-01 DIAGNOSIS — Z79899 Other long term (current) drug therapy: Secondary | ICD-10-CM

## 2023-12-01 DIAGNOSIS — I2510 Atherosclerotic heart disease of native coronary artery without angina pectoris: Secondary | ICD-10-CM | POA: Diagnosis not present

## 2023-12-01 DIAGNOSIS — E785 Hyperlipidemia, unspecified: Secondary | ICD-10-CM

## 2023-12-01 MED ORDER — METOPROLOL TARTRATE 25 MG PO TABS: 25.0000 mg | ORAL_TABLET | Freq: Two times a day (BID) | ORAL | 6 refills | Status: DC

## 2023-12-01 NOTE — Progress Notes (Unsigned)
Cardiology Office Note:  .   Date:  12/03/2023  ID:  Marie [facility], DOB 1966-07-17, MRN [id number] PCP: Department, [facility]  [facility] Providers Cardiologist:  Yates Decamp, MD     History of Present Illness: .   Marie Ayala is a 56 y.o. female with past medical history of hypertension who was recently seen in the [facility] on 11/20/2023 after presented with choking sensation.  Initial EKG showed bowing of ST segment in V2-V4.  Troponin was elevated up to 700.  Blood pressure was also elevated as well.  Cardiac catheterization performed on 11/20/2023 showed 95 to 99% stenosis in proximal LAD, 80% D1 lesion, 50% mid LAD lesion, 90% D2 lesion, 70 to 80% lesion in large OM 3, 90% lesion in very small OM1 and OM 2.  Proximal LAD was treated with 3.5 x 18 mm Onyx frontier DES.  Postprocedure, she was placed on aspirin and Brilinta.  Echocardiogram obtained on the same day showed EF 55 to 60%, grade 1 DD, no significant valve issue.  She was placed on 80 mg Lipitor.  Patient presents today for follow-up accompanied by daughter.  She denies any choking sensation which is her anginal symptom since discharge.  She has been compliant with aspirin and Brilinta.  We have discussed the importance of compliance with dual antiplatelet therapy.  She has no problem affording Brilinta.  Blood pressure remain borderline elevated, I will increase metoprolol tartrate to 25 mg twice a day.  I recommended repeat fasting lipid panel and 50 in 2 months.  She can follow-up with Dr. Jacinto Halim in 3 to 4 months.  ROS:   She denies chest pain, palpitations, dyspnea, pnd, orthopnea, n, v, dizziness, syncope, edema, weight gain, or early satiety. All other systems reviewed and are otherwise negative except as noted above.    Studies Reviewed: .        Cardiac Studies & Procedures   CARDIAC CATHETERIZATION  CARDIAC CATHETERIZATION 11/20/2023  Narrative Images from the original  result were not included. Left Heart Catheterization 11/20/23: Hemodynamic data: LV 108/21, EDP 15 mmHg.  Ao 107/58, mean 82 mmHg.  No pressure gradient across the aortic valve.  Angiographic data: LV: Normal LV systolic function.  No wall motion abnormality. LM: Large-caliber vessel.  It is smooth and normal. LAD: Large-caliber vessel.  Proximal LAD starting from the near ostium, has a focal 95 to 99% stenosis with TIMI II flow in the LAD.  Large D1 with a proximal 80% stenosis.  The LAD after D1 has mild diffuse disease and at the origin of D2 there is a 50% stenosis and the ostium of the D2 has a 90% stenosis. LCx: Moderate caliber vessel.  Gives origin to very small OM 1 and 2 with ostial 90% stenosis, proximal CX/20% stenosis.  Proximal to large OM 3 there is a focal 70 to 80% stenosis in the CX. RCA: Dominant, mild disease in the midsegment.  Gives origin to large PL and PDA branch.  Intervention data: Successful PTCA and stenting of the ostial and proximal LAD with 3.5 x 18 mm Onyx frontier DES postdilated with 3 oh by 8 mm [place] balloon at 20 atmospheric pressure, stenosis reduced from 99% to 0% and TIMI flow 2-3 postprocedure.    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  She will need DAPT for 1 year.  Findings Coronary Findings Diagnostic  Dominance: Right  Left Anterior Descending Prox LAD to Mid LAD  lesion is 99% stenosed. Vessel is the culprit lesion. The lesion is type C and ulcerative. Mid LAD lesion is 50% stenosed.  First Diagonal Branch 1st Diag lesion is 80% stenosed.  Second Diagonal Branch 2nd Diag lesion is 95% stenosed.  Left Circumflex Prox Cx lesion is 20% stenosed. Mid Cx to Dist Cx lesion is 70% stenosed.  First Obtuse Marginal Branch Vessel is small in size. 1st Mrg lesion is 90% stenosed.  Second Obtuse Marginal Branch Vessel is small in size. 2nd Mrg lesion is 90%  stenosed.  Right Coronary Artery Mid RCA lesion is 10% stenosed.  Intervention  Prox LAD to Mid LAD lesion Stent Lesion length:  15 mm. CATH LAUNCHER 6FR EBU 3 guide catheter was inserted. Lesion crossed with guidewire using a WIRE ASAHI PROWATER 180CM. Pre-stent angioplasty was performed using a BALLN SAPPHIRE 3.0X15. Maximum pressure:  16 atm. Inflation time:  60 sec. A drug-eluting stent was successfully placed using a STENT ONYX FRONTIER 3.5X18. Maximum pressure: 16 atm. Inflation time: 60 sec. Stent strut is well apposed. Post-stent angioplasty was performed using a BALLN Flagler EUPHORA RX 3.5X8. Maximum pressure:  20 atm. Inflation time:  45 sec. Post-Intervention Lesion Assessment The intervention was successful. Pre-interventional TIMI flow is 2. Post-intervention TIMI flow is 3. No complications occurred at this lesion. There is a 0% residual stenosis post intervention.    ECHOCARDIOGRAM  ECHOCARDIOGRAM COMPLETE 11/20/2023  Narrative ECHOCARDIOGRAM REPORT    Patient Name:   Marie [facility] Date of Exam: 11/20/2023 Medical Rec #:  [id number]                  Height:       63.0 in Accession #:    [id number]                 Weight:       160.0 lb Date of Birth:  1966-10-16                  BSA:          1.759 m Patient Age:    56 years                   BP:           122/80 mmHg Patient Gender: F                          HR:           72 bpm. Exam Location:  Inpatient  Procedure: 2D Echo, Color Doppler and Cardiac Doppler  Indications:    elevated troponin  History:        Patient has no prior history of Echocardiogram examinations. Risk Factors:Hypertension, Dyslipidemia and Former Smoker.  Sonographer:    Delcie Roch RDCS Referring Phys: [phone] DEBBY CROSLEY  IMPRESSIONS   1. Left ventricular ejection fraction, by estimation, is 55 to 60%. The left ventricle has normal function. The left ventricle has no regional wall motion abnormalities. Left  ventricular diastolic parameters are consistent with Grade I diastolic dysfunction (impaired relaxation). 2. Right ventricular systolic function is normal. The right ventricular size is normal. Tricuspid regurgitation signal is inadequate for assessing PA pressure. 3. Left atrial size was mildly dilated. 4. The mitral valve is normal in structure. No evidence of mitral valve regurgitation. No evidence of mitral stenosis. 5. The aortic valve is tricuspid. Aortic valve regurgitation is not visualized. No aortic stenosis is present. 6.  The inferior vena cava is normal in size with greater than 50% respiratory variability, suggesting right atrial pressure of 3 mmHg.  FINDINGS Left Ventricle: Left ventricular ejection fraction, by estimation, is 55 to 60%. The left ventricle has normal function. The left ventricle has no regional wall motion abnormalities. The left ventricular internal cavity size was normal in size. There is no left ventricular hypertrophy. Left ventricular diastolic parameters are consistent with Grade I diastolic dysfunction (impaired relaxation).  Right Ventricle: The right ventricular size is normal. No increase in right ventricular wall thickness. Right ventricular systolic function is normal. Tricuspid regurgitation signal is inadequate for assessing PA pressure.  Left Atrium: Left atrial size was mildly dilated.  Right Atrium: Right atrial size was normal in size.  Pericardium: There is no evidence of pericardial effusion.  Mitral Valve: The mitral valve is normal in structure. No evidence of mitral valve regurgitation. No evidence of mitral valve stenosis.  Tricuspid Valve: The tricuspid valve is normal in structure. Tricuspid valve regurgitation is not demonstrated.  Aortic Valve: The aortic valve is tricuspid. Aortic valve regurgitation is not visualized. No aortic stenosis is present.  Pulmonic Valve: The pulmonic valve was normal in structure. Pulmonic valve  regurgitation is trivial.  Aorta: The aortic root is normal in size and structure.  Venous: The inferior vena cava is normal in size with greater than 50% respiratory variability, suggesting right atrial pressure of 3 mmHg.  IAS/Shunts: No atrial level shunt detected by color flow Doppler.   LEFT VENTRICLE PLAX 2D LVIDd:         3.80 cm   Diastology LVIDs:         2.20 cm   LV e' medial:    6.09 cm/s LV PW:         1.00 cm   LV E/e' medial:  9.1 LV IVS:        1.10 cm   LV e' lateral:   8.92 cm/s LVOT diam:     1.80 cm   LV E/e' lateral: 6.2 LV SV:         58 LV SV Index:   33 LVOT Area:     2.54 cm   RIGHT VENTRICLE             IVC RV Basal diam:  2.70 cm     IVC diam: 1.30 cm RV S prime:     10.70 cm/s TAPSE (M-mode): 2.3 cm  LEFT ATRIUM             Index        RIGHT ATRIUM           Index LA diam:        3.40 cm 1.93 cm/m   RA Area:     11.90 cm LA Vol (A2C):   47.5 ml 27.01 ml/m  RA Volume:   26.20 ml  14.90 ml/m LA Vol (A4C):   52.8 ml 30.02 ml/m LA Biplane Vol: 53.2 ml 30.25 ml/m AORTIC VALVE LVOT Vmax:   101.00 cm/s LVOT Vmean:  70.400 cm/s LVOT VTI:    0.229 m  AORTA Ao Root diam: 3.10 cm Ao Asc diam:  3.10 cm  MITRAL VALVE MV Area (PHT): 5.13 cm    SHUNTS MV Decel Time: 148 msec    Systemic VTI:  0.23 m MV E velocity: 55.50 cm/s  Systemic Diam: 1.80 cm MV A velocity: 54.80 cm/s MV E/A ratio:  1.01  [employer] Electronically signed by Wilfred Lacy Signature  Date/Time: 11/20/2023/9:09:32 AM    Final             Risk Assessment/Calculations:            Physical Exam:   VS:  BP (!) 136/98   Pulse 85   Ht 5\' 3"  (1.6 m)   Wt 163 lb (73.9 kg)   SpO2 98%   BMI 28.87 kg/m    Wt Readings from Last 3 Encounters:  12/01/23 163 lb (73.9 kg)  11/20/23 160 lb (72.6 kg)  10/04/23 155 lb (70.3 kg)    GEN: Well nourished, well developed in no acute distress NECK: No JVD; No carotid bruits CARDIAC: RRR, no murmurs, rubs,  gallops RESPIRATORY:  Clear to auscultation without rales, wheezing or rhonchi  ABDOMEN: Soft, non-tender, non-distended EXTREMITIES:  No edema; No deformity   ASSESSMENT AND PLAN: .     Coronary Artery Disease Recent hospitalization for elevated troponin (700) and multiple coronary artery stenoses, including a 95% proximal LAD lesion treated with a 3.5x56mm Onyx Frontier DES. No recurrence of choking sensation since stent placement. -Continue Aspirin and Brilinta, emphasizing the importance of compliance to prevent stent occlusion. -Check fasting lipid panel and liver function tests in 2 months to assess cholesterol control.  Hypertension Blood pressure was elevated during recent hospitalization. -Increase Metoprolol to 25mg  twice daily.  Hyperlipidemia: On Lipitor 80 mg daily.  Fasting lipid panel LFT in 2 months.       Dispo: Follow-up with Dr. Jacinto Halim in 3 to 4 months  Signed, Azalee Course, [place]

## 2023-12-01 NOTE — Patient Instructions (Signed)
Medication Instructions:  INCREASE METOPROLOL TARTRATE 25 MG TWICE DAILY *If you need a refill on your cardiac medications before your next appointment, please call your pharmacy*   Lab Work: FASTING LIPIDS AND LFT IN 2 MONTHS If you have labs (blood work) drawn today and your tests are completely normal, you will receive your results only by: MyChart Message (if you have MyChart) OR A paper copy in the mail If you have any lab test that is abnormal or we need to change your treatment, we will call you to review the results.   Testing/Procedures: NO TESTING   Follow-Up: At [facility], you and your health needs are our priority.  As part of our continuing mission to provide you with exceptional heart care, we have created designated Provider Care Teams.  These Care Teams include your primary Cardiologist (physician) and Advanced Practice Providers (APPs -  Physician Assistants and Nurse Practitioners) who all work together to provide you with the care you need, when you need it.  We recommend signing up for the patient portal called "MyChart".  Sign up information is provided on this After Visit Summary.  MyChart is used to connect with patients for Virtual Visits (Telemedicine).  Patients are able to view lab/test results, encounter notes, upcoming appointments, etc.  Non-urgent messages can be sent to your provider as well.   To learn more about what you can do with MyChart, go to [email or web address].    Your next appointment:   3-4 month(s)  Provider:   Yates Decamp, MD

## 2023-12-14 ENCOUNTER — Ambulatory Visit: Payer: Medicaid Other | Admitting: Cardiology

## 2024-01-19 ENCOUNTER — Telehealth: Payer: Self-pay

## 2024-01-19 ENCOUNTER — Telehealth: Payer: Self-pay | Admitting: Physician Assistant

## 2024-01-19 MED ORDER — LISINOPRIL 5 MG PO TABS: 5.0000 mg | ORAL_TABLET | Freq: Every day | ORAL | 3 refills | Status: DC

## 2024-01-19 MED ORDER — ATORVASTATIN CALCIUM 80 MG PO TABS: 80.0000 mg | ORAL_TABLET | Freq: Every evening | ORAL | 3 refills | Status: DC

## 2024-01-19 MED ORDER — TICAGRELOR 90 MG PO TABS: 90.0000 mg | ORAL_TABLET | Freq: Two times a day (BID) | ORAL | 3 refills | Status: DC

## 2024-01-19 NOTE — Telephone Encounter (Signed)
*  STAT* If patient is at the pharmacy, call can be transferred to refill team.   1. Which medications need to be refilled? (please list name of each medication and dose if known)  Brilinta, Atorvastatin,and  Lisinopril    2. Would you like to learn more about the convenience, safety, & potential cost savings by using the [facility]?    3. Are you open to using the [facility] (Type [facility].   4. Which pharmacy/location (including street and city if local pharmacy) is medication to be sent to?[address] [place]   5. Do they need a 30 day or 90 day supply? 30 days and refill- please call today- out of medicine

## 2024-01-19 NOTE — Telephone Encounter (Signed)
Patient needs OV appointment had questions about refills on a medication.  Patient is available on wednesdays in the morning. We will contact her if there is a cancellation.

## 2024-01-19 NOTE — Telephone Encounter (Signed)
Medication has been refilled by cardiologist.

## 2024-02-19 ENCOUNTER — Encounter: Payer: Self-pay | Admitting: Cardiology

## 2024-02-19 LAB — HEPATIC FUNCTION PANEL
ALT: 23 IU/L (ref 0–32)
AST: 21 IU/L (ref 0–40)
Albumin: 4.4 g/dL (ref 3.8–4.9)
Alkaline Phosphatase: 121 IU/L (ref 44–121)
Bilirubin Total: 1.2 mg/dL (ref 0.0–1.2)
Bilirubin, Direct: 0.37 mg/dL (ref 0.00–0.40)
Total Protein: 7.1 g/dL (ref 6.0–8.5)

## 2024-02-19 LAB — LIPID PANEL
Chol/HDL Ratio: 3.2 ratio (ref 0.0–4.4)
Cholesterol, Total: 158 mg/dL (ref 100–199)
HDL: 50 mg/dL (ref >39)
LDL Chol Calc (NIH): 90 mg/dL (ref 0–99)
Triglycerides: 97 mg/dL (ref 0–149)
VLDL Cholesterol Cal: 18 mg/dL (ref 5–40)

## 2024-02-26 ENCOUNTER — Other Ambulatory Visit: Payer: Self-pay

## 2024-02-26 DIAGNOSIS — E782 Mixed hyperlipidemia: Secondary | ICD-10-CM

## 2024-03-11 ENCOUNTER — Ambulatory Visit: Payer: No Typology Code available for payment source | Admitting: Cardiology

## 2024-04-22 ENCOUNTER — Ambulatory Visit: Payer: Medicaid Other | Attending: Cardiology | Admitting: Cardiology

## 2024-04-22 ENCOUNTER — Other Ambulatory Visit (HOSPITAL_COMMUNITY): Payer: Self-pay

## 2024-04-22 ENCOUNTER — Encounter: Payer: Self-pay | Admitting: Cardiology

## 2024-04-22 VITALS — BP 158/90 | HR 68 | Resp 16 | Ht 63.0 in | Wt 166.2 lb

## 2024-04-22 DIAGNOSIS — I2510 Atherosclerotic heart disease of native coronary artery without angina pectoris: Secondary | ICD-10-CM

## 2024-04-22 DIAGNOSIS — E782 Mixed hyperlipidemia: Secondary | ICD-10-CM

## 2024-04-22 DIAGNOSIS — E7841 Elevated Lipoprotein(a): Secondary | ICD-10-CM

## 2024-04-22 DIAGNOSIS — I10 Essential (primary) hypertension: Secondary | ICD-10-CM

## 2024-04-22 MED ORDER — NITROGLYCERIN 0.4 MG SL SUBL
0.4000 mg | SUBLINGUAL_TABLET | SUBLINGUAL | 3 refills | Status: DC | PRN
  Filled 2024-04-22: qty 25, 15d supply, fill #0
  Filled 2024-06-19: qty 25, 15d supply, fill #1

## 2024-04-22 MED ORDER — LISINOPRIL 10 MG PO TABS
10.0000 mg | ORAL_TABLET | Freq: Every evening | ORAL | 3 refills | Status: DC
Start: 2024-04-22 — End: 2024-06-20
  Filled 2024-04-22: qty 90, 90d supply, fill #0
  Filled 2024-06-19: qty 90, 90d supply, fill #1

## 2024-04-22 MED ORDER — AMLODIPINE BESYLATE 5 MG PO TABS
5.0000 mg | ORAL_TABLET | Freq: Every day | ORAL | 2 refills | Status: DC
  Filled 2024-04-22: qty 30, 30d supply, fill #0
  Filled 2024-05-22: qty 30, 30d supply, fill #1
  Filled 2024-06-19: qty 30, 30d supply, fill #2

## 2024-04-22 NOTE — Patient Instructions (Addendum)
 Medication Instructions:  Your physician has recommended you make the following change in your medication:  Increase lisinopril  to 10 mg by mouth daily Start Amlodipine  5 mg by mouth daily A prescription for Nitroglycerin  has been sent to the pharmacy for you to use as needed  *If you need a refill on your cardiac medications before your next appointment, please call your pharmacy*  Lab Work: Have lab work checked today at [facility] on the first floor--BMP, CBC, lipids If you have labs (blood work) drawn today and your tests are completely normal, you will receive your results only by: MyChart Message (if you have MyChart) OR A paper copy in the mail If you have any lab test that is abnormal or we need to change your treatment, we will call you to review the results.  Testing/Procedures: none  Follow-Up: At [facility], you and your health needs are our priority.  As part of our continuing mission to provide you with exceptional heart care, our providers are all part of one team.  This team includes your primary Cardiologist (physician) and Advanced Practice Providers or APPs (Physician Assistants and Nurse Practitioners) who all work together to provide you with the care you need, when you need it.  Your next appointment:   January 2026  Provider:   Knox Perl, MD    We recommend signing up for the patient portal called "MyChart".  Sign up information is provided on this After Visit Summary.  MyChart is used to connect with patients for Virtual Visits (Telemedicine).  Patients are able to view lab/test results, encounter notes, upcoming appointments, etc.  Non-urgent messages can be sent to your provider as well.   To learn more about what you can do with MyChart, go to [email or web address].   Other Instructions You have been referred to see the pharmacist in our office.  Please schedule new patient appointment  Nitroglycerin  Sublingual Tablets Qu es este  medicamento? La NITROGLICERINA previene y trata Chief Technology Officer en el pecho (angina de pecho). Acta relajando los vasos sanguneos y as se reduce el trabajo que debe realizar el corazn. Pertenece a un grupo de medicamentos llamados nitratos. Este medicamento puede ser utilizado para otros usos; si tiene alguna pregunta consulte con su proveedor de atencin mdica o con su farmacutico. MARCAS COMUNES: Nitroquick, Nitrostat , Nitrotab Qu le debo informar a mi profesional de la salud antes de tomar este medicamento? Necesitan saber si usted presenta alguno de los siguientes problemas o situaciones: Anemia Lesin de la cabeza, accidente cerebrovascular reciente o hemorragia cerebral Enfermedad heptica [place] cardiaco previo Una reaccin alrgica o inusual a la nitroglicerina, a otros medicamentos, alimentos, colorantes o conservantes Si est embarazada o buscando quedar embarazada Si est amamantando a un beb Cmo debo utilizar este medicamento? Tome este medicamento por va oral segn sea necesario. selo ante el primer signo de un ataque de angina de pecho (dolor u opresin en el pecho). Tambin puede usar este medicamento 5 a 10 minutos antes de una situacin que probablemente produzca dolor en el pecho. Siga las instrucciones exactamente como indica la etiqueta del [place]. Colquese una tableta debajo de la lengua y deje que se disuelva. No la trague entera. Remplace la dosis si la traga accidentalmente. Ser de ayuda que su boca no est seca. La saliva alrededor de la tableta ayudar a que se disuelva ms rpido. No coma ni beba, fume ni mastique tabaco mientras se disuelve la tableta. Sintese mientras usa  este medicamento. Con un ataque de angina de  pecho, debera sentirse mejor en un plazo de 5 minutos despus de la primera dosis. Puede tomar una dosis cada 5 minutos hasta un total de 3 dosis. Si no se siente mejor o se siente peor despus de 1 dosis, llame de inmediato al 9-1-1. No tome ms  de 3 dosis en un periodo de 15 minutos. Es posible que su proveedor de atencin Careers adviser d otras instrucciones. Si as sucede, siga esas instrucciones. No use su medicamento con una frecuencia mayor a la indicada. Hable con su proveedor de atencin mdica sobre el uso de este medicamento en nios. Puede requerir atencin especial. Sobredosis: Pngase en contacto inmediatamente con un centro toxicolgico o una sala de urgencia si usted cree que haya tomado demasiado medicamento.<br>ATENCIN: [employer] es solo para usted. No comparta este medicamento con nadie. Qu sucede si me olvido de una dosis? No se aplica en este caso. Este medicamento solo se usa  cuando se necesita. Qu puede interactuar con este medicamento? No use este medicamento con ninguno de los siguientes productos: Ciertos medicamentos para migraas, tales como ergotamina y dihidroergotamina Medicamentos utilizados para tratar la disfuncin erctil, tales como sildenafilo, tadalafilo y vardenafilo Riociguat Este medicamento tambin podra interactuar con los siguientes productos: Alteplasa Aspirina Heparina Medicamentos para la presin arterial Medicamentos para la depresin Otros medicamentos utilizados para tratar la angina de Hotel manager Fenotiazinas, tales como Barista, mesoridazina, proclorperazina, tioridazina Puede ser que esta lista no menciona todas las posibles interacciones. Informe a su profesional de [employer] de [employer] productos a base de hierbas, medicamentos de [place] o suplementos nutritivos que est tomando. Si usted fuma, consume bebidas alcohlicas o si utiliza drogas ilegales, indqueselo tambin a su profesional de [employer]. Algunas sustancias pueden interactuar con su medicamento. A qu debo estar atento al usar [employer]? Hable con su equipo de atencin si siente que su medicamento ya no funciona. Lleve este medicamento con usted en todo momento. Sintese o acustese cuando use el  medicamento para evitar caerse si se marea o se desmaya despus de usarlo. Intente mantener la calma. Esto lo ayudar a sentirse mejor ms rpido. Si se marea, respire hondo varias veces y recustese con los pies elevados o inclnese hacia delante con la cabeza apoyada entre las rodillas. Este medicamento podra afectar su coordinacin, tiempo de reaccin o juicio. No conduzca ni opere maquinaria pesada hasta que sepa cmo le afecta este medicamento. Pngase de pie o levntese lentamente para reducir el riesgo de mareos o [place]. Beber alcohol con [employer] puede aumentar el riesgo de estos efectos secundarios. No se trate usted mismo si tiene tos, resfriado o dolor mientras est usando este medicamento sin consultar con su equipo de atencin. Algunos ingredientes pueden aumentar su presin arterial. Qu efectos secundarios puedo tener al [employer] este medicamento? Efectos secundarios que debe informar a su mdico o a Producer, television/film/video de la salud tan pronto como sea posible: Therapist, art (erupcin cutnea, comezn/picazn o urticaria; hinchazn de la cara, los labios o Scientist, product/process development) presin arterial baja (mareos; sensacin de desmayo o aturdimiento; cadas; debilidad o cansancio inusuales) recuentos bajos de glbulos rojos (dificultad para respirar; sensacin de desmayo; aturdimiento; cadas; debilidad o cansancio inusuales) Efectos secundarios que generalmente no requieren atencin mdica (infrmelos a su mdico o a su profesional de la salud si persisten o si son molestos): enrojecimiento facial dolor de cabeza nuseas, vmito Puede ser que [employer] no menciona todos los posibles efectos secundarios. Comunquese a su mdico por asesoramiento mdico [employer]  secundarios. Usted puede informar los efectos secundarios a la FDA por telfono al 1-800-FDA-1088. Dnde debo guardar mi medicina? Mantenga fuera del alcance de los nios. Guarde a temperatura ambiente, entre 20 y 25  grados Celsius (68 y 27 grados Fahrenheit). Guarde en el recipiente original. Proteja de la luz y la humedad. Mantenga bien cerrado. Deseche todo el medicamento que no haya utilizado despus de la fecha de vencimiento. ATENCIN: Este folleto es un resumen. Puede ser que no cubra toda la posible informacin. Si usted tiene preguntas acerca de esta medicina, consulte con su mdico, su farmacutico o su profesional de Radiographer, therapeutic.  2024 Elsevier/Gold Standard (2023-05-04 00:00:00)

## 2024-04-22 NOTE — Progress Notes (Unsigned)
 Cardiology Office Note:  .   Date:  04/23/2024  ID:  Marie Ayala, DOB October 26, 1966, MRN [id number] PCP: Department, [facility]  [facility] Providers Cardiologist:  Knox Perl, MD   History of Present Illness: .   Marie Ayala is a 58 y.o. Hispanic female patient with hypertension, hyperglycemia, mixed hyperlipidemia with markedly elevated Lp(a), coronary artery disease with non-STEMI on 11/20/2023 and underwent angioplasty and stenting to the proximal LAD, occasional tobacco use in past.  She now presents for follow-up.  Discussed the use of AI scribe software for clinical note transcription with the patient, who gave verbal consent to proceed.  History of Present Illness Marie Ayala is a 58 year old female with coronary artery disease who presents for follow-up after stent placement. She is accompanied by her daughter.  Interpreter is also present for translation.  Approximately six to seven months ago, she underwent stent placement. Since then, she experiences occasional shortness of breath but no chest pain and has not used nitroglycerin .  States that she has not used any cigarettes anymore.  She has not engaged in exercise since the procedure, with work being her primary physical activity. She works from 12 PM to 8 PM, leaving her mornings free. She has quit smoking and cooks at home, indicating lifestyle changes post-heart attack.  Her current medications include aspirin , Brilinta  (ticagrelor ) 90 mg twice daily, atorvastatin  80 mg daily, lisinopril  5 mg daily, and metoprolol  tartrate 25 mg daily. Family history includes her mother who died of a stroke at around 63 years old and a noted family history of heart disease.  Labs   Lab Results  Component Value Date   CHOL 197 04/22/2024   HDL 48 04/22/2024   LDLCALC 122 (H) 04/22/2024   TRIG 153 (H) 04/22/2024   CHOLHDL 4.1 04/22/2024   Lab Results  Component Value Date   NA  143 04/22/2024   K 5.0 04/22/2024   CO2 23 04/22/2024   GLUCOSE 101 (H) 04/22/2024   BUN 16 04/22/2024   CREATININE 0.71 04/22/2024   CALCIUM  10.2 04/22/2024   EGFR 100 04/22/2024   GFRNONAA >60 11/20/2023      Latest Ref Rng & Units 04/22/2024   10:43 AM 11/20/2023    5:32 AM 11/20/2023    1:46 AM  BMP  Glucose 70 - 99 mg/dL 841  324  401   BUN 6 - 24 mg/dL 16  18  17    Creatinine 0.57 - 1.00 mg/dL 0.27  2.53  6.64   BUN/Creat Ratio 9 - 23 23     Sodium 134 - 144 mmol/L 143  139  139   Potassium 3.5 - 5.2 mmol/L 5.0  3.5  2.8   Chloride 96 - 106 mmol/L 105  107  106   CO2 20 - 29 mmol/L 23  23  24    Calcium  8.7 - 10.2 mg/dL 40.3  9.5  9.7       Latest Ref Rng & Units 04/22/2024   10:43 AM 11/20/2023    5:32 AM 11/20/2023    1:46 AM  CBC  WBC 3.4 - 10.8 x10E3/uL 5.7  9.8  7.1   Hemoglobin 11.1 - 15.9 g/dL 47.4  25.9  56.3   Hematocrit 34.0 - 46.6 % 39.8  41.6  43.7   Platelets 150 - 450 x10E3/uL 261  232  246    Lab Results  Component Value Date   HGBA1C 6.0 (H) 09/27/2021    Lab  Results  Component Value Date   TSH 1.400 08/02/2019    Lipoprotein (a) 11/20/2023 <75.0 nmol/L 229.1   ROS  Review of Systems  Cardiovascular:  Negative for chest pain, dyspnea on exertion and leg swelling.   Physical Exam:   VS:  BP (!) 158/90 (BP Location: Left Arm, Patient Position: Sitting, Cuff Size: Normal)   Pulse 68   Resp 16   Ht 5\' 3"  (1.6 m)   Wt 166 lb 3.2 oz (75.4 kg)   SpO2 98%   BMI 29.44 kg/m    Wt Readings from Last 3 Encounters:  04/22/24 166 lb 3.2 oz (75.4 kg)  12/01/23 163 lb (73.9 kg)  11/20/23 160 lb (72.6 kg)    Physical Exam Neck:     Vascular: No carotid bruit or JVD.  Cardiovascular:     Rate and Rhythm: Normal rate and regular rhythm.     Pulses: Intact distal pulses.     Heart sounds: Normal heart sounds. No murmur heard.    No gallop.  Pulmonary:     Effort: Pulmonary effort is normal.     Breath sounds: Normal breath sounds.  Abdominal:      General: Bowel sounds are normal.     Palpations: Abdomen is soft.  Musculoskeletal:     Right lower leg: No edema.     Left lower leg: No edema.    Studies Reviewed: Aaron Aas    Left Heart Catheterization 11/20/23:  Ostial and proximal LAD with 3.5 x 18 mm Onyx frontier DES    EKG:         Medications and allergies    No Known Allergies   Current Outpatient Medications:    amLODipine  (NORVASC ) 5 MG tablet, Take 1 tablet (5 mg total) by mouth daily., Disp: 30 tablet, Rfl: 2   aspirin  EC 81 MG tablet, Take 1 tablet (81 mg total) by mouth daily. Swallow whole., Disp: 30 tablet, Rfl: 12   atorvastatin  (LIPITOR) 80 MG tablet, Take 1 tablet (80 mg total) by mouth every evening., Disp: 90 tablet, Rfl: 3   metoprolol  tartrate (LOPRESSOR ) 25 MG tablet, Take 1 tablet (25 mg total) by mouth 2 (two) times daily., Disp: 60 tablet, Rfl: 6   nitroGLYCERIN  (NITROSTAT ) 0.4 MG SL tablet, Place 1 tablet (0.4 mg total) under the tongue every 5 (five) minutes as needed for chest pain., Disp: 25 tablet, Rfl: 3   ticagrelor  (BRILINTA ) 90 MG TABS tablet, Take 1 tablet (90 mg total) by mouth 2 (two) times daily., Disp: 180 tablet, Rfl: 3   lisinopril  (ZESTRIL ) 10 MG tablet, Take 1 tablet (10 mg total) by mouth every evening., Disp: 90 tablet, Rfl: 3   Meds ordered this encounter  Medications   amLODipine  (NORVASC ) 5 MG tablet    Sig: Take 1 tablet (5 mg total) by mouth daily.    Dispense:  30 tablet    Refill:  2   lisinopril  (ZESTRIL ) 10 MG tablet    Sig: Take 1 tablet (10 mg total) by mouth every evening.    Dispense:  90 tablet    Refill:  3   nitroGLYCERIN  (NITROSTAT ) 0.4 MG SL tablet    Sig: Place 1 tablet (0.4 mg total) under the tongue every 5 (five) minutes as needed for chest pain.    Dispense:  25 tablet    Refill:  3     Medications Discontinued During This Encounter  Medication Reason   lisinopril  (ZESTRIL ) 5 MG tablet Reorder  ASSESSMENT AND PLAN: .      ICD-10-CM   1.  Coronary artery disease involving native coronary artery of native heart without angina pectoris  I25.10 AMB Referral to [facility]    nitroGLYCERIN  (NITROSTAT ) 0.4 MG SL tablet    Basic Metabolic Panel (BMET)    CBC    Lipid Profile    Lipid Profile    CBC    Basic Metabolic Panel (BMET)    CANCELED: Lipid Profile    CANCELED: CBC    CANCELED: Basic Metabolic Panel (BMET)    2. Mixed hyperlipidemia  E78.2     3. Essential hypertension  I10 amLODipine  (NORVASC ) 5 MG tablet    lisinopril  (ZESTRIL ) 10 MG tablet    4. Elevated Lp(a)  E78.41 AMB Referral to [facility] Pharm-D     Assessment & Plan Coronary artery disease with stent placement   Approximately six to seven months post-stent placement in the LAD, she reports no chest pain since the procedure. Her condition is well-managed with aspirin  and Brilinta  (ticagrelor ). Emphasize lifestyle modifications to prevent further blockages. Continue aspirin  and Brilinta  90 mg twice daily. Encourage regular physical activity, such as walking for 30 minutes daily, and educate on lifestyle changes including diet, exercise, and smoking cessation.  She was smoking occasionally, states that she has not smoked since last office visit. S/L NTG was prescribed and explained how to and when to use it and to notify us  if there is change in frequency of use.    Hypertension   Hypertension is managed with lisinopril  and metoprolol  tartrate. Advise home blood pressure monitoring and maintaining a list of medications to carry at all times. Continue metoprolol  tartrate 25 mg daily increase lisinopril  to 10 mg daily and amlodipine  5 mg daily for hypertension control. Recommend obtaining a blood pressure cuff for home use.  Hyperlipidemia   Hyperlipidemia is managed with atorvastatin , with a family history of elevated LPA cholesterol. Recommend additional cholesterol management with bi-weekly injections to address persistent high levels. Continue  atorvastatin  80 mg daily.  In view of markedly elevated Lp(a), she would benefit from PCSK9 inhibitor initiation.  Refer to a pharmacist for cholesterol management and educate on dietary changes along with follow-up on hypertension, BMP will be obtained along with lipid profile testing and CBC in 2 to 3 weeks.  Overweight   With a BMI of 29, weight loss is recommended to reduce cardiovascular risk. Set a goal to lose 5-10 pounds by the next appointment. Encourage dietary modifications and increased physical activity.  I will see her back sometime in January 2026 and consider discontinuation of DAPT.  This was a 47-minute encounter with 25 minutes spent face-to-face on the rest and coordination of care, review of her past medical records, review of labs and counseling to both the patient and her daughter at the bedside.  Needing an interpreter also increased time of care.  Signed,  Knox Perl, MD, [facility] 04/23/2024, 8:59 PM [facility] [address] [place], [place] [address] Phone: [phone]. Fax:  [phone]

## 2024-04-23 LAB — LIPID PANEL
Chol/HDL Ratio: 4.1 ratio (ref 0.0–4.4)
Cholesterol, Total: 197 mg/dL (ref 100–199)
HDL: 48 mg/dL (ref >39)
LDL Chol Calc (NIH): 122 mg/dL — ABNORMAL HIGH (ref 0–99)
Triglycerides: 153 mg/dL — ABNORMAL HIGH (ref 0–149)
VLDL Cholesterol Cal: 27 mg/dL (ref 5–40)

## 2024-04-23 LAB — BASIC METABOLIC PANEL WITH GFR
BUN/Creatinine Ratio: 23 (ref 9–23)
BUN: 16 mg/dL (ref 6–24)
CO2: 23 mmol/L (ref 20–29)
Calcium: 10.2 mg/dL (ref 8.7–10.2)
Chloride: 105 mmol/L (ref 96–106)
Creatinine, Ser: 0.71 mg/dL (ref 0.57–1.00)
Glucose: 101 mg/dL — ABNORMAL HIGH (ref 70–99)
Potassium: 5 mmol/L (ref 3.5–5.2)
Sodium: 143 mmol/L (ref 134–144)
eGFR: 100 mL/min/{1.73_m2} (ref >59)

## 2024-04-23 LAB — CBC
Hematocrit: 39.8 % (ref 34.0–46.6)
Hemoglobin: 13.1 g/dL (ref 11.1–15.9)
MCH: 30.9 pg (ref 26.6–33.0)
MCHC: 32.9 g/dL (ref 31.5–35.7)
MCV: 94 fL (ref 79–97)
Platelets: 261 10*3/uL (ref 150–450)
RBC: 4.24 x10E6/uL (ref 3.77–5.28)
RDW: 12.6 % (ref 11.7–15.4)
WBC: 5.7 10*3/uL (ref 3.4–10.8)

## 2024-04-25 NOTE — Progress Notes (Signed)
 I don't think I have seen this patient, have I?  Thanks MJP

## 2024-05-03 ENCOUNTER — Ambulatory Visit: Payer: Self-pay | Admitting: Cardiology

## 2024-05-03 NOTE — Progress Notes (Signed)
 He was referred to pharmacy program for initiation of Repatha

## 2024-05-23 ENCOUNTER — Other Ambulatory Visit (HOSPITAL_COMMUNITY): Payer: Self-pay

## 2024-05-24 ENCOUNTER — Other Ambulatory Visit (HOSPITAL_COMMUNITY): Payer: Self-pay

## 2024-06-13 ENCOUNTER — Encounter: Payer: Self-pay | Admitting: Pharmacist Clinician (PhC)/ Clinical Pharmacy Specialist

## 2024-06-13 ENCOUNTER — Telehealth: Payer: Self-pay | Admitting: Pharmacy Technician

## 2024-06-13 ENCOUNTER — Other Ambulatory Visit (HOSPITAL_COMMUNITY): Payer: Self-pay

## 2024-06-13 ENCOUNTER — Telehealth: Payer: Self-pay | Admitting: Pharmacist Clinician (PhC)/ Clinical Pharmacy Specialist

## 2024-06-13 ENCOUNTER — Ambulatory Visit: Attending: Internal Medicine | Admitting: Pharmacist Clinician (PhC)/ Clinical Pharmacy Specialist

## 2024-06-13 DIAGNOSIS — E782 Mixed hyperlipidemia: Secondary | ICD-10-CM | POA: Diagnosis present

## 2024-06-13 NOTE — Assessment & Plan Note (Signed)
 Assessment: Patient with ASCVD not at LDL goal of < 70 Most recent LDL 122 on 04/22/24 Has been compliant with high intensity statin : atorvastatin  80 Addition of ezetimibe would not be reasonable to get to LDL goal at this time Reviewed options for lowering LDL cholesterol, including PCSK-9 inhibitors and inclisiran.  Discussed mechanisms of action, dosing, side effects, potential decreases in LDL cholesterol and costs.  Also reviewed potential options for patient assistance.  Plan: Patient agreeable to starting Repatha 140 mg q14d Continue atorvastatin  80 mg daily Repeat labs after:  3 months Lipid Liver function

## 2024-06-13 NOTE — Telephone Encounter (Signed)
 Please do PA for Repatha

## 2024-06-13 NOTE — Progress Notes (Signed)
 Office Visit    Patient Name: Marie Ayala Date of Encounter: 06/13/2024  Primary Care Provider:  Department, [facility] Primary Cardiologist:  Gordy Bergamo, MD  Chief Complaint    Hyperlipidemia   Significant Past Medical History   CAD nSTEMI 11/24 w/DES to pLAD  HTN Recent increase in lisinopril , amlodipine   preDM 4/22 A1c 6     No Known Allergies  History of Present Illness    Marie Ayala is a 58 y.o. female patient of Dr Bergamo, in the office today to discuss options for cholesterol management.   The interpreter was late to this appointment, and patient declined online interpreter.  Did review with interpreter help that patient did not have any further questions and understood discussion during appointment.   Insurance Carrier:  Publishing copy  Pharmacy:  [facility]   LDL Cholesterol goal:  LDL < 70  Current Medications:   atorvastatin  80 mg qd  Family Hx:  mother died from stroke at 73 MI; father and siblings all without heart issues; 4 kids 12-32, no heart issues  Social Hx: Tobacco: no Alcohol: no     Diet:  eats mostly home cooked meals, variety of proteins and fresh vegetables; no regular snacking     Exercise: [employer] about 3 times per week, up to an hour per session   Accessory Clinical Findings   Lab Results  Component Value Date   CHOL 197 04/22/2024   HDL 48 04/22/2024   LDLCALC 122 (H) 04/22/2024   TRIG 153 (H) 04/22/2024   CHOLHDL 4.1 04/22/2024    Lipoprotein (a)  Date/Time Value Ref Range Status  11/20/2023 05:32 AM 229.1 (H) <75.0 nmol/L Final    Comment:    (NOTE) Note:  Values greater than or equal to 75.0 nmol/L may       indicate an independent risk factor for CHD,       but must be evaluated with caution when applied       to non-Caucasian populations due to the       influence of genetic factors on Lp(a) across       ethnicities. Performed At: [facility] [address] [place], [place] [id number] Jennette Shorter MD Ey:[phone]     Lab Results  Component Value Date   ALT 23 02/19/2024   AST 21 02/19/2024   ALKPHOS 121 02/19/2024   BILITOT 1.2 02/19/2024   Lab Results  Component Value Date   CREATININE 0.71 04/22/2024   BUN 16 04/22/2024   NA 143 04/22/2024   K 5.0 04/22/2024   CL 105 04/22/2024   CO2 23 04/22/2024   Lab Results  Component Value Date   HGBA1C 6.0 (H) 09/27/2021    Home Medications    Current Outpatient Medications  Medication Sig Dispense Refill   amLODipine  (NORVASC ) 5 MG tablet Take 1 tablet (5 mg total) by mouth daily. 30 tablet 2   aspirin  EC 81 MG tablet Take 1 tablet (81 mg total) by mouth daily. Swallow whole. 30 tablet 12   atorvastatin  (LIPITOR) 80 MG tablet Take 1 tablet (80 mg total) by mouth every evening. 90 tablet 3   lisinopril  (ZESTRIL ) 10 MG tablet Take 1 tablet (10 mg total) by mouth every evening. 90 tablet 3   metoprolol  tartrate (LOPRESSOR ) 25 MG tablet Take 1 tablet (25 mg total) by mouth 2 (two) times daily. 60 tablet 6   nitroGLYCERIN  (NITROSTAT ) 0.4 MG SL tablet Place 1 tablet (0.4  mg total) under the tongue every 5 (five) minutes as needed for chest pain. 25 tablet 3   ticagrelor  (BRILINTA ) 90 MG TABS tablet Take 1 tablet (90 mg total) by mouth 2 (two) times daily. 180 tablet 3   No current facility-administered medications for this visit.     Assessment & Plan    Hyperlipidemia Assessment: Patient with ASCVD not at LDL goal of < 70 Most recent LDL 122 on 04/22/24 Has been compliant with high intensity statin : atorvastatin  80 Addition of ezetimibe would not be reasonable to get to LDL goal at this time Reviewed options for lowering LDL cholesterol, including PCSK-9 inhibitors and inclisiran.  Discussed mechanisms of action, dosing, side effects, potential decreases in LDL cholesterol and costs.  Also reviewed potential options for patient assistance.  Plan: Patient agreeable to starting  Repatha 140 mg q14d Continue atorvastatin  80 mg daily Repeat labs after:  3 months Lipid Liver function   Allean Mink, PharmD CPP [facility] [address]   [place], [place] [address] [phone]  06/13/2024, 8:36 AM

## 2024-06-13 NOTE — Telephone Encounter (Signed)
 Sent to kristin: Hi, the patient does not have active insurance that I can find. I called [facility] and they had medicaid for this patient but medicaid said the insurance is termed. The patient just got prescriptions at our pharmacy but they were ran under the pharmacy cash price.      06/13/24  8:41 AM Alvstad, Kristin L, RPH-CPP routed this conversation to Rx Prior Auth Team (Selected Message) Marie Ayala, RPH-CPP    06/13/24  8:41 AM Note Please do PA for Repatha

## 2024-06-13 NOTE — Patient Instructions (Signed)
 Your Results:             Your most recent labs Goal  Total Cholesterol 197 < 200  Triglycerides 153 < 150  HDL (happy/good cholesterol) 48 > 40  LDL (lousy/bad cholesterol 122 < 70   Medication changes:  We will start the process to get Repatha covered by your insurance.  Once the prior authorization is complete, I will call/send a MyChart message to let you know and confirm pharmacy information.   You will take one injection every 14 days  Lab orders:  We want to repeat labs after 2-3 months.  We will send you a lab order to remind you once we get closer to that time.     Thank you for choosing [facility]

## 2024-06-15 NOTE — Telephone Encounter (Signed)
 Patient reports she is on Medicaid, has a new card.  Asked that she either send a copy via MyChart or come into the office and have it scanned in.   Patient agreeable to plan.

## 2024-06-16 NOTE — Telephone Encounter (Signed)
 Called patient and asked that she bring in her current insurance information.  Patient agreeable

## 2024-06-19 ENCOUNTER — Encounter: Payer: Self-pay | Admitting: Internal Medicine

## 2024-06-20 ENCOUNTER — Other Ambulatory Visit: Payer: Self-pay

## 2024-06-20 ENCOUNTER — Ambulatory Visit: Payer: Medicaid Other | Admitting: Internal Medicine

## 2024-06-20 ENCOUNTER — Encounter: Payer: Self-pay | Admitting: Internal Medicine

## 2024-06-20 ENCOUNTER — Other Ambulatory Visit (HOSPITAL_COMMUNITY): Payer: Self-pay

## 2024-06-20 VITALS — BP 138/84 | HR 84 | Resp 12 | Ht 64.0 in | Wt 164.0 lb

## 2024-06-20 DIAGNOSIS — I10 Essential (primary) hypertension: Secondary | ICD-10-CM

## 2024-06-20 DIAGNOSIS — I2510 Atherosclerotic heart disease of native coronary artery without angina pectoris: Secondary | ICD-10-CM

## 2024-06-20 DIAGNOSIS — Z9861 Coronary angioplasty status: Secondary | ICD-10-CM

## 2024-06-20 DIAGNOSIS — E010 Iodine-deficiency related diffuse (endemic) goiter: Secondary | ICD-10-CM

## 2024-06-20 DIAGNOSIS — Z5971 Insufficient health insurance coverage: Secondary | ICD-10-CM

## 2024-06-20 MED ORDER — ATORVASTATIN CALCIUM 80 MG PO TABS: 80.0000 mg | ORAL_TABLET | Freq: Every evening | ORAL | 3 refills | Status: AC

## 2024-06-20 MED ORDER — TICAGRELOR 90 MG PO TABS: 90.0000 mg | ORAL_TABLET | Freq: Two times a day (BID) | ORAL | 3 refills | Status: AC

## 2024-06-20 MED ORDER — METOPROLOL TARTRATE 25 MG PO TABS: 25.0000 mg | ORAL_TABLET | Freq: Two times a day (BID) | ORAL | 3 refills | Status: AC

## 2024-06-20 MED ORDER — AMLODIPINE BESYLATE 5 MG PO TABS: 5.0000 mg | ORAL_TABLET | Freq: Every day | ORAL | 3 refills | Status: DC

## 2024-06-20 MED ORDER — ATORVASTATIN CALCIUM 80 MG PO TABS: 80.0000 mg | ORAL_TABLET | Freq: Every evening | ORAL | 1 refills | Status: DC

## 2024-06-20 MED ORDER — AMLODIPINE BESYLATE 5 MG PO TABS: 5.0000 mg | ORAL_TABLET | Freq: Every day | ORAL | 1 refills | Status: DC

## 2024-06-20 MED ORDER — LISINOPRIL 10 MG PO TABS: 10.0000 mg | ORAL_TABLET | Freq: Every evening | ORAL | 3 refills | Status: AC

## 2024-06-20 MED ORDER — NITROGLYCERIN 0.4 MG SL SUBL: 0.4000 mg | SUBLINGUAL_TABLET | SUBLINGUAL | 3 refills | Status: AC | PRN

## 2024-06-20 MED ORDER — TICAGRELOR 90 MG PO TABS: 90.0000 mg | ORAL_TABLET | Freq: Two times a day (BID) | ORAL | 1 refills | Status: DC

## 2024-06-20 NOTE — Patient Instructions (Signed)
 Tome un vaso de agua antes de cada comida Tome un minimo de 6 a 8 vasos de agua diarios Coma tres veces al dia Coma una proteina y Neomia Dear grasa saludable con comida.  (huevos, pescado, pollo, pavo, y limite carnes rojas Coma 5 porciones diarias de legumbres.  Mezcle los colores Coma 2 porciones diarias de frutas con cascara cuando sea comestible Use platos pequeos Suelte su tenedor o cuchara despues de cada mordida hata que se mastique y se trague Come en la mesa con amigos o familiares por lo menos una vez al dia [place] la televisin y aparatos electrnicos durante la comida  Su objetivo debe ser perder una libra por semana  Estudios recientes indican que las personas quienes consumen todos de sus calorias durante 12 horas se bajan de pesocon Mas eficiencia.  Por ejemplo, si Usted come su primera comida a las 7:00 a.m., su comida final del dia se debe completar antes de las 7:00 p.m.

## 2024-06-20 NOTE — Progress Notes (Signed)
 Subjective:    Patient ID: Marie Ayala, female   DOB: [birth year], 58 y.o.   MRN: [id number]   HPI  Here to re establish--has not been seen here since 2022.   She would like to avoid interpreter today if possible   CAD:  NSTEMI suffered 10/2023 after off meds for bp and cholesterol for some time.  Underwent PTCA/stenting of proximal LAD lesion and now on Brilinta  for at least 1 year.  Followed by Dr. Ladona, cardiology.  Echo at time of Troponin elevation did not show regional wall motion abnormality and EF of 55%.  She is now taking Atorvastatin  80 mg daily and Amlodipine  5 mg, Lisinopril  10 mg daily and Metoprolol  25 mg twice daily.  No chest pain since her hospitalization  2.  Hypertension:  BP up and down, but okay today.  Meds above.  No problems with meds and does not miss.    3.  Hyperlipidemia:  cholesterol too high in May.  Was sent to pharm for Repatha.  She has not been set up with that yet.  She states she was taking Atorvastatin  at the time, but question whether she has actually been able to afford based on comments today.  She only has family planning with medicaid.    4.  History of prediabetes:  A1C in 2022 was 6.0%.  Do not see that this has been rechecked.    Current Meds  Medication Sig   amLODipine  (NORVASC ) 5 MG tablet Take 1 tablet (5 mg total) by mouth daily.   aspirin  EC 81 MG tablet Take 1 tablet (81 mg total) by mouth daily. Swallow whole.   atorvastatin  (LIPITOR) 80 MG tablet Take 1 tablet (80 mg total) by mouth every evening.   lisinopril  (ZESTRIL ) 10 MG tablet Take 1 tablet (10 mg total) by mouth every evening.   metoprolol  tartrate (LOPRESSOR ) 25 MG tablet Take 1 tablet (25 mg total) by mouth 2 (two) times daily.   nitroGLYCERIN  (NITROSTAT ) 0.4 MG SL tablet Place 1 tablet (0.4 mg total) under the tongue every 5 (five) minutes as needed for chest pain.   ticagrelor  (BRILINTA ) 90 MG TABS tablet Take 1 tablet (90 mg total) by mouth 2 (two)  times daily.   No Known Allergies   Review of Systems    Objective:   BP 138/84 (BP Location: Right Arm, Patient Position: Sitting, Cuff Size: Normal)   Pulse 84   Resp 12   Ht 5' 4 (1.626 m)   Wt 164 lb (74.4 kg)   BMI 28.15 kg/m   Physical Exam HENT:     Head: Normocephalic and atraumatic.     Right Ear: Tympanic membrane, ear canal and external ear normal.     Left Ear: Tympanic membrane, ear canal and external ear normal.     Mouth/Throat:     Mouth: Mucous membranes are moist.     Pharynx: Oropharynx is clear.  Eyes:     Extraocular Movements: Extraocular movements intact.     Conjunctiva/sclera: Conjunctivae normal.     Pupils: Pupils are equal, round, and reactive to light.  Neck:     Thyroid: Thyromegaly (generous) present. No thyroid mass.  Cardiovascular:     Rate and Rhythm: Normal rate and regular rhythm.     Heart sounds: S1 normal and S2 normal. No murmur heard.    No friction rub. No S3 or S4 sounds.     Comments: No carotid bruits.  Carotid,  radial, femoral, DP and PT pulses normal and equal.   Pulmonary:     Effort: Pulmonary effort is normal.     Breath sounds: Normal breath sounds and air entry.  Abdominal:     General: Bowel sounds are normal.     Palpations: Abdomen is soft.  Musculoskeletal:     Cervical back: Normal range of motion and neck supple.     Right lower leg: No edema.     Left lower leg: No edema.  Skin:    General: Skin is warm.     Capillary Refill: Capillary refill takes less than 2 seconds.     Findings: No rash.  Neurological:     General: No focal deficit present.     Mental Status: She is alert and oriented to person, place, and time.      Assessment & Plan   CAD:  States taking meds to limit risks regularly.  Currently, no new symptoms  2.  Mild thyromegaly:  No history of US  thyroid, but TSH normal with enlarged thyroid in past.  TSH with fasting labs  3.  Hypertension:  Fair control  4.  Hyperlipidemia:   Schedule fasting lipids  5.  Underinsured:  She does not have orange card and does have possibility of getting full Medicaid--referral to Legal [employer] to look into that for her.  Gave referral to [facility] today.

## 2024-06-23 ENCOUNTER — Encounter (HOSPITAL_COMMUNITY): Payer: Self-pay

## 2024-06-23 ENCOUNTER — Other Ambulatory Visit (HOSPITAL_COMMUNITY): Payer: Self-pay

## 2024-06-23 ENCOUNTER — Other Ambulatory Visit: Payer: Self-pay | Admitting: Cardiology

## 2024-06-23 DIAGNOSIS — I10 Essential (primary) hypertension: Secondary | ICD-10-CM

## 2024-07-15 ENCOUNTER — Other Ambulatory Visit: Payer: Self-pay

## 2024-07-15 DIAGNOSIS — E782 Mixed hyperlipidemia: Secondary | ICD-10-CM

## 2024-07-15 DIAGNOSIS — Z0000 Encounter for general adult medical examination without abnormal findings: Secondary | ICD-10-CM

## 2024-07-16 LAB — COMPREHENSIVE METABOLIC PANEL WITH GFR
ALT: 21 IU/L (ref 0–32)
AST: 19 IU/L (ref 0–40)
Albumin: 4.4 g/dL (ref 3.8–4.9)
Alkaline Phosphatase: 125 IU/L — ABNORMAL HIGH (ref 44–121)
BUN/Creatinine Ratio: 17 (ref 9–23)
BUN: 15 mg/dL (ref 6–24)
Bilirubin Total: 1.1 mg/dL (ref 0.0–1.2)
CO2: 24 mmol/L (ref 20–29)
Calcium: 9.9 mg/dL (ref 8.7–10.2)
Chloride: 104 mmol/L (ref 96–106)
Creatinine, Ser: 0.87 mg/dL (ref 0.57–1.00)
Globulin, Total: 2.7 g/dL (ref 1.5–4.5)
Glucose: 103 mg/dL — ABNORMAL HIGH (ref 70–99)
Potassium: 4.5 mmol/L (ref 3.5–5.2)
Sodium: 140 mmol/L (ref 134–144)
Total Protein: 7.1 g/dL (ref 6.0–8.5)
eGFR: 78 mL/min/1.73 (ref >59)

## 2024-07-16 LAB — CBC WITH DIFFERENTIAL/PLATELET
Basophils Absolute: 0.1 x10E3/uL (ref 0.0–0.2)
Basos: 1 %
EOS (ABSOLUTE): 0.2 x10E3/uL (ref 0.0–0.4)
Eos: 3 %
Hematocrit: 38.5 % (ref 34.0–46.6)
Hemoglobin: 12.2 g/dL (ref 11.1–15.9)
Immature Grans (Abs): 0 x10E3/uL (ref 0.0–0.1)
Immature Granulocytes: 0 %
Lymphocytes Absolute: 1.5 x10E3/uL (ref 0.7–3.1)
Lymphs: 25 %
MCH: 30.3 pg (ref 26.6–33.0)
MCHC: 31.7 g/dL (ref 31.5–35.7)
MCV: 96 fL (ref 79–97)
Monocytes Absolute: 0.5 x10E3/uL (ref 0.1–0.9)
Monocytes: 8 %
Neutrophils Absolute: 3.9 x10E3/uL (ref 1.4–7.0)
Neutrophils: 63 %
Platelets: 246 x10E3/uL (ref 150–450)
RBC: 4.02 x10E6/uL (ref 3.77–5.28)
RDW: 13.2 % (ref 11.7–15.4)
WBC: 6.2 x10E3/uL (ref 3.4–10.8)

## 2024-07-16 LAB — LIPID PANEL W/O CHOL/HDL RATIO
Cholesterol, Total: 168 mg/dL (ref 100–199)
HDL: 49 mg/dL (ref >39)
LDL Chol Calc (NIH): 102 mg/dL — ABNORMAL HIGH (ref 0–99)
Triglycerides: 92 mg/dL (ref 0–149)
VLDL Cholesterol Cal: 17 mg/dL (ref 5–40)

## 2024-07-16 LAB — TSH: TSH: 1.56 u[IU]/mL (ref 0.450–4.500)

## 2024-07-16 LAB — HEMOGLOBIN A1C
Est. average glucose Bld gHb Est-mCnc: 126 mg/dL
Hgb A1c MFr Bld: 6 % — ABNORMAL HIGH (ref 4.8–5.6)

## 2024-09-12 ENCOUNTER — Ambulatory Visit: Payer: Self-pay | Admitting: Internal Medicine

## 2024-10-07 ENCOUNTER — Other Ambulatory Visit: Payer: Self-pay | Admitting: Internal Medicine

## 2024-10-26 ENCOUNTER — Other Ambulatory Visit: Payer: Self-pay | Admitting: Internal Medicine

## 2024-10-26 ENCOUNTER — Ambulatory Visit: Payer: Self-pay | Admitting: Internal Medicine

## 2024-10-26 ENCOUNTER — Encounter: Payer: Self-pay | Admitting: Internal Medicine

## 2024-10-26 VITALS — BP 142/85 | HR 60 | Resp 17 | Ht 63.75 in | Wt 171.0 lb

## 2024-10-26 DIAGNOSIS — Z1211 Encounter for screening for malignant neoplasm of colon: Secondary | ICD-10-CM

## 2024-10-26 DIAGNOSIS — B9689 Other specified bacterial agents as the cause of diseases classified elsewhere: Secondary | ICD-10-CM

## 2024-10-26 DIAGNOSIS — N898 Other specified noninflammatory disorders of vagina: Secondary | ICD-10-CM

## 2024-10-26 DIAGNOSIS — Z0000 Encounter for general adult medical examination without abnormal findings: Secondary | ICD-10-CM

## 2024-10-26 DIAGNOSIS — R7303 Prediabetes: Secondary | ICD-10-CM

## 2024-10-26 DIAGNOSIS — I10 Essential (primary) hypertension: Secondary | ICD-10-CM

## 2024-10-26 DIAGNOSIS — Z124 Encounter for screening for malignant neoplasm of cervix: Secondary | ICD-10-CM

## 2024-10-26 DIAGNOSIS — N760 Acute vaginitis: Secondary | ICD-10-CM

## 2024-10-26 DIAGNOSIS — I2510 Atherosclerotic heart disease of native coronary artery without angina pectoris: Secondary | ICD-10-CM

## 2024-10-26 DIAGNOSIS — Z1231 Encounter for screening mammogram for malignant neoplasm of breast: Secondary | ICD-10-CM

## 2024-10-26 LAB — POCT WET PREP WITH KOH
KOH Prep POC: NEGATIVE
RBC Wet Prep HPF POC: NEGATIVE
Trichomonas, UA: NEGATIVE
Yeast Wet Prep HPF POC: NEGATIVE

## 2024-10-26 MED ORDER — CITRACAL +D3 PO TABS: ORAL_TABLET | ORAL | Status: AC

## 2024-10-26 NOTE — Progress Notes (Signed)
 "   Subjective:    Patient ID: Marie Ayala, female   DOB: 03/23/1966, 58 y.o.   MRN: [id number]   HPI  CPE with pap  1.  Pap:  Last 07/2019 with ASCUS and negative HPV.    2.  Mammogram:  Last 11/2019 and normal.  No family history of breast cancer.    3.  Osteoprevention:  Does not take in milk products regularly.  Not physically active.  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    4.  Guaiac Cards/FIT:  Never returned.    5.  Colonoscopy:  Never.  No family history of colon cancer.   6.  Immunizations:  Declines all recommended vaccines.  There is no immunization history on file for this patient.   7.  Glucose/Cholesterol:  Prediabetes with stable A1C at 6.0% in July 2025.  Cholesterol not at goal in July with LDL at 102.  She is on max dose of Atorvastatin  and cardiology was planning to start PCSK-9 inhibitor or inclisiran.  She did not have Medicaid at the time.  She has not notified Dr. Godfrey office of the coverage.    Current Meds  Medication Sig   amLODipine  (NORVASC ) 5 MG tablet Take 1 tablet by mouth once daily   aspirin  EC 81 MG tablet Take 1 tablet (81 mg total) by mouth daily. Swallow whole.   atorvastatin  (LIPITOR) 80 MG tablet Take 1 tablet (80 mg total) by mouth every evening.   lisinopril  (ZESTRIL ) 10 MG tablet Take 1 tablet (10 mg total) by mouth every evening.   metoprolol  tartrate (LOPRESSOR ) 25 MG tablet Take 1 tablet (25 mg total) by mouth 2 (two) times daily.   nitroGLYCERIN  (NITROSTAT ) 0.4 MG SL tablet Place 1 tablet (0.4 mg total) under the tongue every 5 (five) minutes as needed for chest pain.   ticagrelor  (BRILINTA ) 90 MG TABS tablet Take 1 tablet (90 mg total) by mouth 2 (two) times daily.   No Known Allergies  Past Medical History:  Diagnosis Date   CAD S/P percutaneous coronary angioplasty 10/2023   proximal LAD with non STEMI   Essential hypertension 07/28/2017   Hyperlipidemia 2019    Hypertension 2016   Overweight (BMI 25.0-29.9) 2020   Past Surgical History:  Procedure Laterality Date   CESAREAN SECTION  1991, 2001, 2012   CORONARY STENT INTERVENTION N/A 11/20/2023   Procedure: CORONARY STENT INTERVENTION;  Surgeon: Ladona Heinz, MD;  Location: [facility];  Service: Cardiovascular;  Laterality: N/A;   LEFT HEART CATH AND CORONARY ANGIOGRAPHY N/A 11/20/2023   Procedure: LEFT HEART CATH AND CORONARY ANGIOGRAPHY;  Surgeon: Ladona Heinz, MD;  Location: [facility];  Service: Cardiovascular;  Laterality: N/A;   Family History  Problem Relation Age of Onset   Hypertension Mother    Heart disease Mother        MI   Diabetes Mother    Social History   Socioeconomic History   Marital status: Legally Separated    Spouse name: Israel Davila   Number of children: 5   Years of education: 9   Highest education level: Not on file  Occupational History   Occupation: Housekeeping   Occupation: kitchen work in [employer]  Tobacco Use   Smoking status: Former    Current packs/day: 0.00    Average packs/day: 0.1 packs/day for 11.8 years (0.6 ttl pk-yrs)    Types: Cigarettes    Start date: 2013  Quit date: 10/2023    Years since quitting: 1.0    Passive exposure: Past   Smokeless tobacco: Never   Tobacco comments:    Quit smoking her 1 cigarette daily after non STEMI 10/2023  Vaping Use   Vaping status: Never Used  Substance and Sexual Activity   Alcohol use: No   Drug use: No   Sexual activity: Not Currently    Birth control/protection: Post-menopausal  Other Topics Concern   Not on file  Social History Narrative   Lives in [place] with one son and one daughter   She is not formally divorced from her husband, but he is with another woman and they are separated now.   Rest of children are adults and on their own   Social Drivers of Corporate Investment Banker Strain: Low Risk  (10/26/2024)   Overall Financial Resource Strain (CARDIA)    Difficulty  of Paying Living Expenses: Not very hard  Food Insecurity: No Food Insecurity (10/26/2024)   Hunger Vital Sign    Worried About Running Out of Food in the Last Year: Never true    Ran Out of Food in the Last Year: Never true  Transportation Needs: No Transportation Needs (10/26/2024)   PRAPARE - Administrator, Civil Service (Medical): No    Lack of Transportation (Non-Medical): No  Physical Activity: Inactive (08/02/2019)   Exercise Vital Sign    Days of Exercise per Week: 0 days    Minutes of Exercise per Session: 0 min  Stress: Stress Concern Present (08/02/2019)   [employer] of Occupational Health - Occupational Stress Questionnaire    Feeling of Stress : Rather much  Social Connections: Unknown (08/02/2019)   Social Connection and Isolation Panel    Frequency of Communication with Friends and Family: Not on file    Frequency of Social Gatherings with Friends and Family: Not on file    Attends Religious Services: Not on file    Active Member of Clubs or Organizations: Not on file    Attends Banker Meetings: Not on file    Marital Status: Married  Intimate Partner Violence: Not At Risk (10/26/2024)   Humiliation, Afraid, Rape, and Kick questionnaire    Fear of Current or Ex-Partner: No    Emotionally Abused: No    Physically Abused: No    Sexually Abused: No      Review of Systems  HENT:  Negative for dental problem.   Eyes:  Negative for visual disturbance.  Respiratory:  Negative for shortness of breath.   Cardiovascular:  Negative for chest pain, palpitations and leg swelling.  Gastrointestinal:  Negative for abdominal pain and blood in stool (No melena).  Neurological:  Negative for weakness and numbness.  Psychiatric/Behavioral:  Negative for dysphoric mood. The patient is not nervous/anxious.       Objective:   BP (!) 142/85 (BP Location: Right Arm, Cuff Size: Normal)   Pulse 60   Resp 17   Ht 5' 3.75 (1.619 m)   Wt 171 lb (77.6  kg)   BMI 29.58 kg/m   Physical Exam HENT:     Head: Normocephalic and atraumatic.     Right Ear: Tympanic membrane, ear canal and external ear normal.     Left Ear: Tympanic membrane, ear canal and external ear normal.     Nose: Nose normal.     Mouth/Throat:     Mouth: Mucous membranes are moist.     Pharynx: Oropharynx is  clear.  Eyes:     Extraocular Movements: Extraocular movements intact.     Conjunctiva/sclera: Conjunctivae normal.     Pupils: Pupils are equal, round, and reactive to light.     Comments: Discs sharp  Neck:     Thyroid: Thyromegaly present. No thyroid mass.  Cardiovascular:     Heart sounds: S1 normal and S2 normal. No murmur heard.    No friction rub. No S3 sounds.     Comments: No carotid bruits.  Carotid, radial, femoral, DP and PT pulses normal and equal.   Pulmonary:     Effort: Pulmonary effort is normal.     Breath sounds: Normal breath sounds and air entry.  Chest:  Breasts:    Right: No inverted nipple, mass or nipple discharge.     Left: No inverted nipple, mass or nipple discharge.  Abdominal:     General: Bowel sounds are normal.     Palpations: Abdomen is soft. There is no hepatomegaly, splenomegaly or mass.  Genitourinary:    Comments: Normal external female genitalia Scant light yellow discharge No cervical or vaginal mucosal inflammation or lesion. No uterine or adnexal mass or tenderness.  Musculoskeletal:     Cervical back: Normal range of motion and neck supple.     Right lower leg: No edema.     Left lower leg: No edema.  Lymphadenopathy:     Head:     Right side of head: No submental or submandibular adenopathy.     Left side of head: No submental or submandibular adenopathy.     Cervical: No cervical adenopathy.     Upper Body:     Right upper body: No supraclavicular or axillary adenopathy.     Left upper body: No supraclavicular or axillary adenopathy.     Lower Body: No right inguinal adenopathy. No left inguinal  adenopathy.  Skin:    Capillary Refill: Capillary refill takes less than 2 seconds.     Findings: No rash.  Neurological:     Mental Status: She is alert.     Cranial Nerves: Cranial nerves 2-12 are intact.     Sensory: Sensation is intact.     Motor: Motor function is intact.     Coordination: Coordination is intact.     Gait: Gait is intact.     Deep Tendon Reflexes: Reflexes are normal and symmetric.  Psychiatric:        Speech: Speech normal.        Behavior: Behavior normal. Behavior is cooperative.      Assessment & Plan   CPE with pap Mammogram ordered Declined all recommended vaccines. Screening colonoscopy referral. Eye referral  2.  Hypertension:  States taking meds.  Encouraged lifestyle changes with weekly goals  3.  Mild vaginal discharge:  possibly BV by wet prep, but asymptomatic.  GC/chlamydia pending.  4.  Prediabetes:  A1C with fasting labs in 2 weeks.  5.  CAD:  Stable following stent placement.  Optimize risk factors.  Cardiology with plans for cholesterol lowering meds.    6.  Hyperlipidemia:  as per cardiology. FLP with labs.     "

## 2024-10-28 LAB — CYTOLOGY - PAP

## 2024-10-29 LAB — GC/CHLAMYDIA PROBE AMP
Chlamydia trachomatis, NAA: POSITIVE — AB
Neisseria Gonorrhoeae by PCR: NEGATIVE

## 2024-10-31 ENCOUNTER — Ambulatory Visit: Payer: Self-pay | Admitting: Internal Medicine

## 2024-10-31 NOTE — Progress Notes (Signed)
 I notified her about the massage  and ask about her partners; she says she has not had any partners or sexual intercourse in the last six years.  And she is going take the mentioned tests on her next appointment with us .

## 2024-11-01 ENCOUNTER — Ambulatory Visit
Admission: RE | Admit: 2024-11-01 | Discharge: 2024-11-01 | Disposition: A | Discharge status: Home / Self Care | Source: Ambulatory Visit | Attending: Internal Medicine | Admitting: Internal Medicine

## 2024-11-01 DIAGNOSIS — Z1231 Encounter for screening mammogram for malignant neoplasm of breast: Secondary | ICD-10-CM

## 2024-11-02 MED ORDER — DOXYCYCLINE HYCLATE 100 MG PO TABS: ORAL_TABLET | ORAL | 0 refills | Status: AC

## 2024-11-11 ENCOUNTER — Other Ambulatory Visit (INDEPENDENT_AMBULATORY_CARE_PROVIDER_SITE_OTHER)

## 2024-11-11 DIAGNOSIS — Z0000 Encounter for general adult medical examination without abnormal findings: Secondary | ICD-10-CM | POA: Diagnosis not present

## 2024-11-12 LAB — CBC WITH DIFFERENTIAL/PLATELET
Basophils Absolute: 0.1 x10E3/uL (ref 0.0–0.2)
Basos: 1 %
EOS (ABSOLUTE): 0.2 x10E3/uL (ref 0.0–0.4)
Eos: 4 %
Hematocrit: 38.3 % (ref 34.0–46.6)
Hemoglobin: 12.2 g/dL (ref 11.1–15.9)
Immature Grans (Abs): 0 x10E3/uL (ref 0.0–0.1)
Immature Granulocytes: 0 %
Lymphocytes Absolute: 1.9 x10E3/uL (ref 0.7–3.1)
Lymphs: 32 %
MCH: 30.3 pg (ref 26.6–33.0)
MCHC: 31.9 g/dL (ref 31.5–35.7)
MCV: 95 fL (ref 79–97)
Monocytes Absolute: 0.4 x10E3/uL (ref 0.1–0.9)
Monocytes: 7 %
Neutrophils Absolute: 3.2 x10E3/uL (ref 1.4–7.0)
Neutrophils: 56 %
Platelets: 240 x10E3/uL (ref 150–450)
RBC: 4.02 x10E6/uL (ref 3.77–5.28)
RDW: 12.7 % (ref 11.7–15.4)
WBC: 5.8 x10E3/uL (ref 3.4–10.8)

## 2024-11-12 LAB — LIPID PANEL W/O CHOL/HDL RATIO
Cholesterol, Total: 182 mg/dL (ref 100–199)
HDL: 53 mg/dL (ref >39)
LDL Chol Calc (NIH): 105 mg/dL — ABNORMAL HIGH (ref 0–99)
Triglycerides: 136 mg/dL (ref 0–149)
VLDL Cholesterol Cal: 24 mg/dL (ref 5–40)

## 2024-11-12 LAB — COMPREHENSIVE METABOLIC PANEL WITH GFR
ALT: 27 IU/L (ref 0–32)
AST: 22 IU/L (ref 0–40)
Albumin: 4.3 g/dL (ref 3.8–4.9)
Alkaline Phosphatase: 110 IU/L (ref 49–135)
BUN/Creatinine Ratio: 26 — ABNORMAL HIGH (ref 9–23)
BUN: 17 mg/dL (ref 6–24)
Bilirubin Total: 1 mg/dL (ref 0.0–1.2)
CO2: 23 mmol/L (ref 20–29)
Calcium: 9.9 mg/dL (ref 8.7–10.2)
Chloride: 105 mmol/L (ref 96–106)
Creatinine, Ser: 0.66 mg/dL (ref 0.57–1.00)
Globulin, Total: 2.6 g/dL (ref 1.5–4.5)
Glucose: 101 mg/dL — ABNORMAL HIGH (ref 70–99)
Potassium: 4.7 mmol/L (ref 3.5–5.2)
Sodium: 143 mmol/L (ref 134–144)
Total Protein: 6.9 g/dL (ref 6.0–8.5)
eGFR: 103 mL/min/1.73 (ref >59)

## 2024-11-12 LAB — HEMOGLOBIN A1C
Est. average glucose Bld gHb Est-mCnc: 128 mg/dL
Hgb A1c MFr Bld: 6.1 % — ABNORMAL HIGH (ref 4.8–5.6)

## 2024-11-12 LAB — HIV ANTIBODY (ROUTINE TESTING W REFLEX): HIV Screen 4th Generation wRfx: NONREACTIVE

## 2024-11-12 LAB — HEPATITIS C ANTIBODY: Hep C Virus Ab: NONREACTIVE

## 2024-11-12 LAB — SYPHILIS: RPR W/REFLEX TO RPR TITER AND TREPONEMAL ANTIBODIES, TRADITIONAL SCREENING AND DIAGNOSIS ALGORITHM: RPR Ser Ql: NONREACTIVE

## 2025-01-09 ENCOUNTER — Ambulatory Visit: Payer: Self-pay | Admitting: Internal Medicine

## 2025-03-23 ENCOUNTER — Ambulatory Visit: Admitting: Cardiology

## 2025-04-25 ENCOUNTER — Ambulatory Visit: Admitting: Internal Medicine

## 2025-10-27 ENCOUNTER — Other Ambulatory Visit: Admitting: Internal Medicine

## 2025-10-31 ENCOUNTER — Encounter: Admitting: Internal Medicine
